# Patient Record
Sex: Male | Born: 1980 | Race: Black or African American | Hispanic: No | Marital: Married | Smoking: Never smoker
Health system: Southern US, Community
[De-identification: ages and names within clinical notes are randomized; demographics above are authoritative.]

## PROBLEM LIST (undated history)

## (undated) DIAGNOSIS — B181 Chronic viral hepatitis B without delta-agent: Secondary | ICD-10-CM

## (undated) DIAGNOSIS — R2231 Localized swelling, mass and lump, right upper limb: Secondary | ICD-10-CM

## (undated) HISTORY — PX: NO PAST SURGERIES: SHX2092

---

## 2017-01-04 ENCOUNTER — Ambulatory Visit (INDEPENDENT_AMBULATORY_CARE_PROVIDER_SITE_OTHER): Payer: Self-pay | Admitting: Physician Assistant

## 2017-01-18 ENCOUNTER — Ambulatory Visit (INDEPENDENT_AMBULATORY_CARE_PROVIDER_SITE_OTHER): Payer: Medicaid Other | Admitting: Physician Assistant

## 2017-01-18 ENCOUNTER — Encounter (INDEPENDENT_AMBULATORY_CARE_PROVIDER_SITE_OTHER): Payer: Self-pay | Admitting: Physician Assistant

## 2017-01-18 VITALS — BP 126/77 | HR 101 | Temp 99.0°F | Resp 18 | Ht 67.0 in | Wt 190.0 lb

## 2017-01-18 DIAGNOSIS — R768 Other specified abnormal immunological findings in serum: Secondary | ICD-10-CM | POA: Diagnosis not present

## 2017-01-18 DIAGNOSIS — D696 Thrombocytopenia, unspecified: Secondary | ICD-10-CM

## 2017-01-18 DIAGNOSIS — R109 Unspecified abdominal pain: Secondary | ICD-10-CM | POA: Diagnosis not present

## 2017-01-18 DIAGNOSIS — L989 Disorder of the skin and subcutaneous tissue, unspecified: Secondary | ICD-10-CM | POA: Diagnosis not present

## 2017-01-18 DIAGNOSIS — S29012A Strain of muscle and tendon of back wall of thorax, initial encounter: Secondary | ICD-10-CM | POA: Diagnosis not present

## 2017-01-18 DIAGNOSIS — R350 Frequency of micturition: Secondary | ICD-10-CM | POA: Diagnosis not present

## 2017-01-18 LAB — POCT URINALYSIS DIPSTICK
Bilirubin, UA: NEGATIVE
Glucose, UA: NEGATIVE
LEUKOCYTES UA: NEGATIVE
Nitrite, UA: NEGATIVE
PH UA: 5.5 (ref 5.0–8.0)
SPEC GRAV UA: 1.025 (ref 1.010–1.025)
UROBILINOGEN UA: 0.2 U/dL

## 2017-01-18 MED ORDER — NAPROXEN 500 MG PO TABS: 500.0000 mg | ORAL_TABLET | Freq: Two times a day (BID) | ORAL | 0 refills | Status: DC | PRN

## 2017-01-18 MED ORDER — CYCLOBENZAPRINE HCL 10 MG PO TABS: 10.0000 mg | ORAL_TABLET | Freq: Every day | ORAL | 0 refills | Status: DC

## 2017-01-18 NOTE — Progress Notes (Signed)
Subjective:  Patient ID: Caleb Chandler, male    DOB: Jun 12, 1980  Age: 36 y.o. MRN: [id number]  CC: new patient  HPI Caleb Chandler is a 36 y.o. male with a medical history of Hepatitis B presents as a new patient. Says he was taking medications for Hepatitis B for 7 months. Stopped taking medications on his own because medication was difficult to obtain in [place]. Medical review before arriving summarized HBsAg pos, HBeAg neg, Platelet 150x10, INR/PT/PTT normal, LFT normal, slight elevation of direct bilirubin, chlamydia negative.    Right wrist fracture and right palmar with lesion since he was 36 yo after falling from a mango tree. Did not have proper medical attention for his injury.     Right lower flank pain since approximately one month ago. Pain felt only when applying pressure or sleeping on right side. Has noted increased urinary frequency. No dysuria or hematuria.    ROS Review of Systems  Constitutional: Negative for chills, fever and malaise/fatigue.  Eyes: Negative for blurred vision.  Respiratory: Negative for shortness of breath.   Cardiovascular: Negative for chest pain and palpitations.  Gastrointestinal: Negative for abdominal pain and nausea.  Genitourinary: Negative for dysuria and hematuria.  Musculoskeletal: Positive for back pain. Negative for joint pain and myalgias.  Skin: Negative for rash.  Neurological: Negative for tingling and headaches.  Psychiatric/Behavioral: Negative for depression. The patient is not nervous/anxious.     Objective:  BP 126/77 (BP Location: Right Arm, Patient Position: Sitting, Cuff Size: Normal)   Pulse (!) 101   Temp 99 F (37.2 C) (Oral)   Resp 18   Ht 5\' 7"  (1.702 m)   Wt 190 lb (86.2 kg)   SpO2 96%   BMI 29.76 kg/m   BP/Weight 10/17/4032  Systolic BP 742  Diastolic BP 77  Wt. (Lbs) 190  BMI 29.76      Physical Exam  Constitutional: He is oriented to person, place, and time.  Well developed, well nourished, NAD,  polite  HENT:  Head: Normocephalic and atraumatic.  Eyes: No scleral icterus.  Neck: Normal range of motion. Neck supple. No thyromegaly present.  Cardiovascular: Normal rate, regular rhythm and normal heart sounds.   Pulmonary/Chest: Effort normal and breath sounds normal.  Abdominal: Soft. Bowel sounds are normal. There is no tenderness.  Musculoskeletal: He exhibits no edema.  Full aROM of the back with pain elicited on left rotation and right lateral flexion.  Neurological: He is alert and oriented to person, place, and time. He has normal reflexes. No cranial nerve deficit. Coordination normal.  Strength 5/5 throughout  Skin: Skin is warm and dry. No rash noted. No erythema. No pallor.  Large freely movable nodule with a large papular growth in the center of the right palm.   Psychiatric: He has a normal mood and affect. His behavior is normal. Thought content normal.  Vitals reviewed.    Assessment & Plan:   1. Strain of muscle and tendon of back wall of thorax, initial encounter - cyclobenzaprine (FLEXERIL) 10 MG tablet; Take 1 tablet (10 mg total) by mouth at bedtime.  Dispense: 15 tablet; Refill: 0 - naproxen (NAPROSYN) 500 MG tablet; Take 1 tablet (500 mg total) by mouth 2 (two) times daily as needed.  Dispense: 30 tablet; Refill: 0  2. Right flank pain - US Abdomen Limited RUQ; Future - cyclobenzaprine (FLEXERIL) 10 MG tablet; Take 1 tablet (10 mg total) by mouth at bedtime.  Dispense: 15 tablet; Refill: 0 - naproxen (NAPROSYN)  500 MG tablet; Take 1 tablet (500 mg total) by mouth 2 (two) times daily as needed.  Dispense: 30 tablet; Refill: 0  3. Urinary frequency - Urinalysis Dipstick with trace blood, protein, and ketone in clinic today. Retest at f/u.  4. Hepatitis B surface antigen positive - Hepatitis panel, acute - Comprehensive metabolic panel - US Abdomen Limited RUQ; Future  5. Thrombocytopenia (HCC) - CBC with Differential  6. Skin lesion on examination -  Referral to hand surgeon    Meds ordered this encounter  Medications  . cyclobenzaprine (FLEXERIL) 10 MG tablet    Sig: Take 1 tablet (10 mg total) by mouth at bedtime.    Dispense:  15 tablet    Refill:  0    Order Specific Question:   Supervising Provider    Answer:   Tresa Garter [id number]  . naproxen (NAPROSYN) 500 MG tablet    Sig: Take 1 tablet (500 mg total) by mouth 2 (two) times daily as needed.    Dispense:  30 tablet    Refill:  0    Order Specific Question:   Supervising Provider    Answer:   Tresa Garter [id number]    Follow-up: Return in about 4 weeks (around 02/15/2017) for Hep B, flank pain.   Clent Demark PA

## 2017-01-18 NOTE — Patient Instructions (Signed)
Flank Pain, Adult Flank pain is pain that is located on the side of the body between the upper abdomen and the back. This area is called the flank. The pain may occur over a short period of time (acute), or it may be long-term or recurring (chronic). It may be mild or severe. Flank pain can be caused by many things, including:  Muscle soreness or injury.  Kidney stones or kidney disease.  Stress.  A disease of the spine (vertebral disk disease).  A lung infection (pneumonia).  Fluid around the lungs (pulmonary edema).  A skin rash caused by the chickenpox virus (shingles).  Tumors that affect the back of the abdomen.  Gallbladder disease.  Follow these instructions at home:  Drink enough fluid to keep your urine clear or pale yellow.  Rest as told by your health care provider.  Take over-the-counter and prescription medicines only as told by your health care provider.  Keep a journal to track what has caused your flank pain and what has made it feel better.  Keep all follow-up visits as told by your health care provider. This is important. Contact a health care provider if:  Your pain is not controlled with medicine.  You have new symptoms.  Your pain gets worse.  You have a fever.  Your symptoms last longer than 2-3 days.  You have trouble urinating or you are urinating very frequently. Get help right away if:  You have trouble breathing or you are short of breath.  Your abdomen hurts or it is swollen or red.  You have nausea or vomiting.  You feel faint or you pass out.  You have blood in your urine. Summary  Flank pain is pain that is located on the side of the body between the upper abdomen and the back.  The pain may occur over a short period of time (acute), or it may be long-term or recurring (chronic). It may be mild or severe.  Flank pain can be caused by many things.  Contact your health care provider if your symptoms get worse or they last  longer than 2-3 days. This information is not intended to replace advice given to you by your health care provider. Make sure you discuss any questions you have with your health care provider. Document Released: 06/23/2005 Document Revised: 07/15/2016 Document Reviewed: 07/15/2016 Elsevier Interactive Patient Education  2018 Elsevier Inc.  

## 2017-01-19 LAB — CBC WITH DIFFERENTIAL/PLATELET
BASOS ABS: 0 10*3/uL (ref 0.0–0.2)
BASOS: 1 %
EOS (ABSOLUTE): 0.3 10*3/uL (ref 0.0–0.4)
EOS: 5 %
HEMATOCRIT: 46 % (ref 37.5–51.0)
Hemoglobin: 15.9 g/dL (ref 13.0–17.7)
IMMATURE GRANS (ABS): 0 10*3/uL (ref 0.0–0.1)
Immature Granulocytes: 0 %
LYMPHS ABS: 2.3 10*3/uL (ref 0.7–3.1)
LYMPHS: 45 %
MCH: 31.1 pg (ref 26.6–33.0)
MCHC: 34.6 g/dL (ref 31.5–35.7)
MCV: 90 fL (ref 79–97)
MONOCYTES: 12 %
Monocytes Absolute: 0.6 10*3/uL (ref 0.1–0.9)
NEUTROS ABS: 1.9 10*3/uL (ref 1.4–7.0)
Neutrophils: 37 %
Platelets: 155 10*3/uL (ref 150–379)
RBC: 5.12 x10E6/uL (ref 4.14–5.80)
RDW: 14 % (ref 12.3–15.4)
WBC: 5.1 10*3/uL (ref 3.4–10.8)

## 2017-01-19 LAB — COMPREHENSIVE METABOLIC PANEL
ALBUMIN: 4.7 g/dL (ref 3.5–5.5)
ALT: 29 IU/L (ref 0–44)
AST: 25 IU/L (ref 0–40)
Albumin/Globulin Ratio: 1.4 (ref 1.2–2.2)
Alkaline Phosphatase: 50 IU/L (ref 39–117)
BUN / CREAT RATIO: 14 (ref 9–20)
BUN: 14 mg/dL (ref 6–20)
Bilirubin Total: 0.6 mg/dL (ref 0.0–1.2)
CALCIUM: 10 mg/dL (ref 8.7–10.2)
CO2: 23 mmol/L (ref 20–29)
CREATININE: 0.98 mg/dL (ref 0.76–1.27)
Chloride: 105 mmol/L (ref 96–106)
GFR, EST AFRICAN AMERICAN: 115 mL/min/{1.73_m2} (ref >59)
GFR, EST NON AFRICAN AMERICAN: 99 mL/min/{1.73_m2} (ref >59)
GLUCOSE: 107 mg/dL — AB (ref 65–99)
Globulin, Total: 3.3 g/dL (ref 1.5–4.5)
Potassium: 4.3 mmol/L (ref 3.5–5.2)
Sodium: 142 mmol/L (ref 134–144)
TOTAL PROTEIN: 8 g/dL (ref 6.0–8.5)

## 2017-01-19 LAB — HEPATITIS PANEL, ACUTE
HEP A IGM: NEGATIVE
Hep B C IgM: NEGATIVE
Hepatitis B Surface Ag: POSITIVE — AB

## 2017-01-20 ENCOUNTER — Telehealth (INDEPENDENT_AMBULATORY_CARE_PROVIDER_SITE_OTHER): Payer: Self-pay

## 2017-01-20 NOTE — Telephone Encounter (Signed)
-----   Message from Clent Demark, PA-C sent at 01/19/2017  5:59 PM EDT ----- Patient to return here for further testing before referral to infectious disease. Awaiting RUQ [place].

## 2017-01-20 NOTE — Telephone Encounter (Signed)
/  Patient verified DOB Patient is aware of needing further Hepatitis workup prior to being referred to [facility]. Patient is scheduled for 01/25/17 AT 10:00. Patient had no further questions at this time.

## 2017-01-25 ENCOUNTER — Other Ambulatory Visit (INDEPENDENT_AMBULATORY_CARE_PROVIDER_SITE_OTHER): Payer: Medicaid Other

## 2017-01-25 ENCOUNTER — Other Ambulatory Visit (INDEPENDENT_AMBULATORY_CARE_PROVIDER_SITE_OTHER): Payer: Self-pay | Admitting: Physician Assistant

## 2017-01-25 DIAGNOSIS — R768 Other specified abnormal immunological findings in serum: Secondary | ICD-10-CM

## 2017-01-25 NOTE — Addendum Note (Signed)
Addended by: [name] on: 01/25/2017 03:23 PM   Modules accepted: Orders

## 2017-01-26 ENCOUNTER — Other Ambulatory Visit (INDEPENDENT_AMBULATORY_CARE_PROVIDER_SITE_OTHER): Payer: Self-pay | Admitting: Physician Assistant

## 2017-01-26 DIAGNOSIS — K759 Inflammatory liver disease, unspecified: Secondary | ICD-10-CM

## 2017-01-26 LAB — HEPATITIS B CORE ANTIBODY, TOTAL: Hep B Core Total Ab: POSITIVE — AB

## 2017-01-26 LAB — HEPATITIS B SURFACE ANTIBODY,QUALITATIVE: Hep B Surface Ab, Qual: NONREACTIVE

## 2017-01-27 ENCOUNTER — Ambulatory Visit (HOSPITAL_COMMUNITY)
Admission: RE | Admit: 2017-01-27 | Discharge: 2017-01-27 | Disposition: A | Discharge status: Home / Self Care | Payer: Medicaid Other | Source: Ambulatory Visit | Attending: Physician Assistant | Admitting: Physician Assistant

## 2017-01-27 DIAGNOSIS — R109 Unspecified abdominal pain: Secondary | ICD-10-CM

## 2017-01-27 DIAGNOSIS — R768 Other specified abnormal immunological findings in serum: Secondary | ICD-10-CM | POA: Insufficient documentation

## 2017-01-27 DIAGNOSIS — K760 Fatty (change of) liver, not elsewhere classified: Secondary | ICD-10-CM | POA: Insufficient documentation

## 2017-02-15 ENCOUNTER — Ambulatory Visit (INDEPENDENT_AMBULATORY_CARE_PROVIDER_SITE_OTHER): Payer: Medicaid Other | Admitting: Physician Assistant

## 2017-02-17 ENCOUNTER — Ambulatory Visit (INDEPENDENT_AMBULATORY_CARE_PROVIDER_SITE_OTHER): Payer: Medicaid Other | Admitting: Physician Assistant

## 2017-02-21 ENCOUNTER — Ambulatory Visit (INDEPENDENT_AMBULATORY_CARE_PROVIDER_SITE_OTHER): Payer: Medicaid Other | Admitting: Internal Medicine

## 2017-02-21 ENCOUNTER — Encounter: Payer: Self-pay | Admitting: Internal Medicine

## 2017-02-21 DIAGNOSIS — B181 Chronic viral hepatitis B without delta-agent: Secondary | ICD-10-CM | POA: Diagnosis not present

## 2017-02-21 NOTE — Assessment & Plan Note (Signed)
He has chronic hepatitis B. His hepatitis B DNA viral load was relatively low 2 months after stopping therapy with entecavir. I will get lab work today to restage his infection and see him back in 4 weeks to determine if he needs to start back on treatment.

## 2017-02-21 NOTE — Progress Notes (Signed)
[facility] for Infectious Disease  Reason for Consult: Chronic hepatitis B Referring Physician:  Domenica Fail PA  Assessment: He has chronic hepatitis B. His hepatitis B DNA viral load was relatively low 2 months after stopping therapy with entecavir. I will get lab work today to restage his infection and see him back in 4 weeks to determine if he needs to start back on treatment.   Plan: 1. Staging lab work today 2. I recommended that his fiance be tested for hepatitis B. If she is not immune she should receive hepatitis B vaccination   Patient Active Problem List   Diagnosis Date Noted  . Chronic viral hepatitis B without delta-agent ([place]) 02/21/2017    Patient's Medications  New Prescriptions   No medications on file  Previous Medications   No medications on file  Modified Medications   No medications on file  Discontinued Medications   CYCLOBENZAPRINE (FLEXERIL) 10 MG TABLET    Take 1 tablet (10 mg total) by mouth at bedtime.   NAPROXEN (NAPROSYN) 500 MG TABLET    Take 1 tablet (500 mg total) by mouth 2 (two) times daily as needed.    HPI: Caleb Chandler is a 36 y.o. male who is referred for evaluation and ongoing management of chronic hepatitis B. He is a refugee from the [place] who was found to have chronic hepatitis B when he donated blood while living in [place] 2 years ago. This was the first time he had ever donated blood. He is not aware of ever being tested for hepatitis previously. He is not sure how he was infected. He has had 5 lifetime sexual contacts, all male. He says that he use condoms consistently with each of them. He has never had a blood transfusion. He has never had a tattoo or other injections while living in [place] or [place].  His mother is living but he is not aware of her hepatitis status.   He saw a physician in [place] last December and started on entecavir. He was also taking Immulant Plus, a supplement containing  Echinacea and goldenseal root. He does not believe that he had a hepatitis B viral load done before starting therapy. He took entecavir daily until sometime in May of this year. He could not continue it because he could not afford the medication. He does not recall missing any doses between December and May. He did not have any problems tolerating it. Lab work done on 09/22/2016 showed that his platelets were slightly low at 146,000. His liver enzymes were normal.  He underwent an immigration physical exam in [place] on 11/06/2016. Notes by the examining physician mentioned that he was hepatitis B E antigen negative. He saw another physician on 11/15/2016. That physician told him he needed to have blood work to determine if he still needed treatment for hepatitis B. His hepatitis B DNA viral load at that time was 929 international units per mL.  He immigrated here to [place] 2 months ago. He is looking for work. He has previously been a Education officer, museum, primarily working with other refugees. He has also worked in an [employer] in [place]. He is not on any other medications. He has never consumed alcohol. He does not use tobacco products. He has never been hospitalized or had any surgeries. He has no known allergies. He is currently engaged to be married next year. His fiance is living in [place]. They have not been sexually  active yet. He does not know if she has been tested for hepatitis B or ever been vaccinated.    Review of Systems: Review of Systems  Constitutional: Negative for chills, diaphoresis, fever, malaise/fatigue and weight loss.  HENT: Negative for sore throat.   Respiratory: Negative for cough, sputum production and shortness of breath.   Cardiovascular: Negative for chest pain.  Gastrointestinal: Negative for abdominal pain, diarrhea, heartburn, nausea and vomiting.  Genitourinary: Negative for dysuria and frequency.  Musculoskeletal: Negative for joint pain and myalgias.  Skin:  Negative for rash.  Neurological: Negative for dizziness and headaches.  Psychiatric/Behavioral: Negative for depression and substance abuse. The patient is not nervous/anxious.       No past medical history on file.  Social History  Substance Use Topics  . Smoking status: Never Smoker  . Smokeless tobacco: Never Used  . Alcohol use No    No family history on file. No Known Allergies  OBJECTIVE: Vitals:   02/21/17 0922  BP: (!) 144/84  Pulse: 90  Temp: 98.5 F (36.9 C)  TempSrc: Oral  Weight: 193 lb (87.5 kg)  Height: 5' 6.93" (1.7 m)   Body mass index is 30.29 kg/m.   Physical Exam  Constitutional: He is oriented to person, place, and time.  He is quite pleasant and in good spirits.  HENT:  Mouth/Throat: No oropharyngeal exudate.  Eyes: Conjunctivae are normal.  Cardiovascular: Normal rate and regular rhythm.   No murmur heard. Pulmonary/Chest: Effort normal and breath sounds normal.  Abdominal: Soft. He exhibits no distension and no mass. There is no tenderness.  Musculoskeletal: Normal range of motion.  He has a large callus on the palm of his right hand that developed after he broke his wrist when he was a young child.  Neurological: He is alert and oriented to person, place, and time.  Skin: No rash noted.  Psychiatric: Mood and affect normal.    Microbiology: No results found for this or any previous visit (from the past 240 hour(s)).  Michel Bickers, MD [facility] for Infect[facility] [phone] pager   [phone] cell 02/21/2017, 10:03 AM

## 2017-02-23 ENCOUNTER — Encounter (INDEPENDENT_AMBULATORY_CARE_PROVIDER_SITE_OTHER): Payer: Self-pay | Admitting: Physician Assistant

## 2017-02-23 ENCOUNTER — Ambulatory Visit (INDEPENDENT_AMBULATORY_CARE_PROVIDER_SITE_OTHER): Payer: Medicaid Other | Admitting: Physician Assistant

## 2017-02-23 VITALS — BP 130/77 | HR 98 | Temp 98.3°F | Wt 200.6 lb

## 2017-02-23 DIAGNOSIS — S29012D Strain of muscle and tendon of back wall of thorax, subsequent encounter: Secondary | ICD-10-CM

## 2017-02-23 DIAGNOSIS — R3129 Other microscopic hematuria: Secondary | ICD-10-CM

## 2017-02-23 DIAGNOSIS — R350 Frequency of micturition: Secondary | ICD-10-CM | POA: Diagnosis not present

## 2017-02-23 DIAGNOSIS — R109 Unspecified abdominal pain: Secondary | ICD-10-CM

## 2017-02-23 DIAGNOSIS — L989 Disorder of the skin and subcutaneous tissue, unspecified: Secondary | ICD-10-CM | POA: Diagnosis not present

## 2017-02-23 DIAGNOSIS — B181 Chronic viral hepatitis B without delta-agent: Secondary | ICD-10-CM | POA: Diagnosis not present

## 2017-02-23 LAB — POCT URINALYSIS DIPSTICK
BILIRUBIN UA: NEGATIVE
GLUCOSE UA: NEGATIVE
Leukocytes, UA: NEGATIVE
Nitrite, UA: NEGATIVE
Protein, UA: NEGATIVE
Spec Grav, UA: 1.025 (ref 1.010–1.025)
Urobilinogen, UA: 0.2 E.U./dL
pH, UA: 5 (ref 5.0–8.0)

## 2017-02-23 NOTE — Patient Instructions (Signed)

## 2017-02-23 NOTE — Progress Notes (Signed)
   Subjective:  Patient ID: Caleb Chandler, male    DOB: [birth year]  Age: 36 y.o. MRN: [id number]  CC: No chief complaint on file.   HPI Caleb Chandler is a 36 y.o. male with a medical history of chronic Hepatitis B infection presents to f/u on back pain, flank pain, urinary frequency, HBV infection, and skin lesion on right hand.    Back pain and flank pain have resolved with cyclobenzaprine and naproxen. Urinary frequency persists and includes nocturia. HBV infection is being managed by ID. Has not heard from hand surgeon for skin lesion of right palm.  ROS Review of Systems  Constitutional: Negative for chills, fever and malaise/fatigue.  Eyes: Negative for blurred vision.  Respiratory: Negative for shortness of breath.   Cardiovascular: Negative for chest pain and palpitations.  Gastrointestinal: Negative for abdominal pain and nausea.  Genitourinary: Positive for frequency. Negative for dysuria and hematuria.  Musculoskeletal: Negative for joint pain and myalgias.  Skin: Negative for rash.  Neurological: Negative for tingling and headaches.  Psychiatric/Behavioral: Negative for depression. The patient is not nervous/anxious.     Objective:  BP 130/77 (BP Location: Left Arm, Patient Position: Sitting, Cuff Size: Normal)   Pulse 98   Temp 98.3 F (36.8 C) (Oral)   Wt 200 lb 9.6 oz (91 kg)   SpO2 97%   BMI 31.49 kg/m   BP/Weight 02/23/2017 74/01/4495 11/17/9161  Systolic BP 846 659 935  Diastolic BP 77 84 77  Wt. (Lbs) 200.6 193 190  BMI 31.49 30.29 29.76      Physical Exam  Constitutional: He is oriented to person, place, and time.  Well developed, well nourished, NAD, polite, pleasant  HENT:  Head: Normocephalic and atraumatic.  Eyes: No scleral icterus.  Cardiovascular: Normal rate.   Pulmonary/Chest: Effort normal.  Neurological: He is alert and oriented to person, place, and time. No cranial nerve deficit. Coordination normal.  Skin: Skin is warm and dry. No rash  noted. No erythema. No pallor.  Large papular lesion on right palm  Psychiatric: He has a normal mood and affect. His behavior is normal. Thought content normal.  Vitals reviewed.    Assessment & Plan:    1. Strain of muscle and tendon of back wall of thorax, initial encounter - Resolved with cyclobenzaprine and naproxen  2. Right flank pain - Resolved with cyclobenzaprine and naproxen  3. Urinary frequency - Urinalysis Dipstick with trace blood and ketone in clinic today. No proteins at this visit. Review of [place] labs he brought in today revealed no blood, proteins, or ketones.  4. Microscopic Hematuria - ANA w/reflex  5. Hepatitis B surface antigen positive - Hepatitis panel, acute - Comprehensive metabolic panel - US Abdomen Limited RUQ; Future  6. Thrombocytopenia (HCC) - CBC with Differential  7. Skin lesion on examination - Referral to hand surgeon

## 2017-02-24 LAB — LIVER FIBROSIS, FIBROTEST-ACTITEST
ALPHA-2-MACROGLOBULIN: 220 mg/dL (ref 106–279)
ALT: 24 U/L (ref 9–46)
APOLIPOPROTEIN A1: 112 mg/dL (ref 94–176)
BILIRUBIN: 0.3 mg/dL (ref 0.2–1.2)
FIBROSIS SCORE: 0.15
GGT: 18 U/L (ref 3–90)
HAPTOGLOBIN: 140 mg/dL (ref 43–212)
Necroinflammat ACT Score: 0.09
REFERENCE ID: 2154934

## 2017-02-24 LAB — HEPATITIS B DNA, ULTRAQUANTITATIVE, PCR
HEPATITIS B DNA (CALC): 3.4 {Log_IU}/mL — AB
Hepatitis B DNA: 2490 IU/mL — ABNORMAL HIGH

## 2017-02-24 LAB — ANA W/REFLEX: ANA: NEGATIVE

## 2017-02-25 LAB — COMPREHENSIVE METABOLIC PANEL
AG RATIO: 1.4 (calc) (ref 1.0–2.5)
ALBUMIN MSPROF: 4.5 g/dL (ref 3.6–5.1)
ALT: 26 U/L (ref 9–46)
AST: 20 U/L (ref 10–40)
Alkaline phosphatase (APISO): 44 U/L (ref 40–115)
BILIRUBIN TOTAL: 0.5 mg/dL (ref 0.2–1.2)
BUN: 18 mg/dL (ref 7–25)
CHLORIDE: 105 mmol/L (ref 98–110)
CO2: 26 mmol/L (ref 20–32)
Calcium: 9.8 mg/dL (ref 8.6–10.3)
Creat: 0.98 mg/dL (ref 0.60–1.35)
GLOBULIN: 3.3 g/dL (ref 1.9–3.7)
GLUCOSE: 99 mg/dL (ref 65–99)
Potassium: 4.2 mmol/L (ref 3.5–5.3)
Sodium: 140 mmol/L (ref 135–146)
TOTAL PROTEIN: 7.8 g/dL (ref 6.1–8.1)

## 2017-02-25 LAB — HEPATITIS B E ANTIBODY: HEP B E AB: REACTIVE — AB

## 2017-02-25 LAB — HEPATITIS C ANTIBODY
Hepatitis C Ab: NONREACTIVE
SIGNAL TO CUT-OFF: 0.03 (ref <1.00)

## 2017-02-25 LAB — HEPATITIS DELTA ANTIBODY: Hepatitis D Ab, Total: NEGATIVE

## 2017-02-25 LAB — HIV ANTIBODY (ROUTINE TESTING W REFLEX): HIV 1&2 Ab, 4th Generation: NONREACTIVE

## 2017-02-25 LAB — PROTIME-INR
INR: 1
PROTHROMBIN TIME: 10.8 s (ref 9.0–11.5)

## 2017-02-27 ENCOUNTER — Ambulatory Visit (INDEPENDENT_AMBULATORY_CARE_PROVIDER_SITE_OTHER): Payer: Medicaid Other

## 2017-02-27 ENCOUNTER — Ambulatory Visit (INDEPENDENT_AMBULATORY_CARE_PROVIDER_SITE_OTHER): Payer: Medicaid Other | Admitting: Orthopaedic Surgery

## 2017-02-27 ENCOUNTER — Telehealth: Payer: Self-pay

## 2017-02-27 ENCOUNTER — Encounter (INDEPENDENT_AMBULATORY_CARE_PROVIDER_SITE_OTHER): Payer: Self-pay | Admitting: Orthopaedic Surgery

## 2017-02-27 DIAGNOSIS — R2231 Localized swelling, mass and lump, right upper limb: Secondary | ICD-10-CM

## 2017-02-27 DIAGNOSIS — M67441 Ganglion, right hand: Secondary | ICD-10-CM | POA: Diagnosis not present

## 2017-02-27 NOTE — Telephone Encounter (Signed)
Patient aware of negative autoimmune disease. Caleb Chandler, CMA

## 2017-02-27 NOTE — Telephone Encounter (Signed)
-----   Message from Clent Demark, PA-C sent at 02/27/2017  1:49 PM EDT ----- Autoimmune disease negative.

## 2017-02-27 NOTE — Progress Notes (Signed)
   Office Visit Note   Patient: Caleb Chandler           Date of Birth: Nov 04, 1980           MRN: [id number] Visit Date: 02/27/2017              Requested by: Clent Demark, PA-C [place], [place] [address] PCP: Clent Demark, PA-C   Assessment & Plan: Visit Diagnoses:  1. Mass of right hand     Plan: Patient is a subcutaneous large calcified mass in the palm of his right hand. Patient will need MRI to rule out malignancy given its size and calcification x-rays. Follow-up after the MRI.  Follow-Up Instructions: Return in about 10 days (around 03/09/2017).   Orders:  Orders Placed This Encounter  Procedures  . XR Hand Complete Right   No orders of the defined types were placed in this encounter.     Procedures: No procedures performed   Clinical Data: No additional findings.   Subjective: Chief Complaint  Patient presents with  . Right Hand - Pain, Cyst    Patient is a very pleasant 36 year old gentleman who was in with an enlarging palmar mass of his right hand for the last 20 years. It is symptomatic when he uses his right hand. Denies any numbness and tingling. Denies any focal motor deficits. He denies any injuries or previous surgeries. He denies a history of cancer. Denies constitutional symptoms. The pain does not radiate. It's worse with direct palpation.    Review of Systems  Constitutional: Negative.   All other systems reviewed and are negative.    Objective: Vital Signs: There were no vitals taken for this visit.  Physical Exam  Constitutional: He is oriented to person, place, and time. He appears well-developed and well-nourished.  HENT:  Head: Normocephalic and atraumatic.  Eyes: Pupils are equal, round, and reactive to light.  Neck: Neck supple.  Pulmonary/Chest: Effort normal.  Abdominal: Soft.  Musculoskeletal: Normal range of motion.  Neurological: He is alert and oriented to person, place, and time.  Skin: Skin  is warm.  Psychiatric: He has a normal mood and affect. His behavior is normal. Judgment and thought content normal.  Nursing note and vitals reviewed.   Ortho Exam Right hand exam shows a large palpable lesion in the central portion of his palm. There is no drainage or skin changes or evidence of infection. This is slightly tender to palpation. He has full use of his hand. Specialty Comments:  No specialty comments available.  Imaging: Xr Hand Complete Right  Result Date: 02/27/2017 Large calcified lesion in the soft tissues on the palmar side of the hand without any obvious evidence of bony involvement    PMFS History: Patient Active Problem List   Diagnosis Date Noted  . Chronic viral hepatitis B without delta-agent ([place]) 02/21/2017   No past medical history on file.  No family history on file.  No past surgical history on file. Social History   Occupational History  . Not on file.   Social History Main Topics  . Smoking status: Never Smoker  . Smokeless tobacco: Never Used  . Alcohol use No  . Drug use: No  . Sexual activity: Not Currently

## 2017-02-27 NOTE — Addendum Note (Signed)
Addended by: Precious Bard on: 02/27/2017 10:46 AM   Modules accepted: Orders

## 2017-03-09 ENCOUNTER — Ambulatory Visit (INDEPENDENT_AMBULATORY_CARE_PROVIDER_SITE_OTHER): Payer: Medicaid Other | Admitting: Orthopaedic Surgery

## 2017-03-12 ENCOUNTER — Ambulatory Visit
Admission: RE | Admit: 2017-03-12 | Discharge: 2017-03-12 | Disposition: A | Discharge status: Home / Self Care | Payer: Medicaid Other | Source: Ambulatory Visit | Attending: Orthopaedic Surgery | Admitting: Orthopaedic Surgery

## 2017-03-12 DIAGNOSIS — R2231 Localized swelling, mass and lump, right upper limb: Secondary | ICD-10-CM

## 2017-03-12 MED ORDER — GADOBENATE DIMEGLUMINE 529 MG/ML IV SOLN
17.0000 mL | Freq: Once | INTRAVENOUS | Status: AC | PRN
  Administered 2017-03-12: 17 mL via INTRAVENOUS

## 2017-03-14 ENCOUNTER — Ambulatory Visit (INDEPENDENT_AMBULATORY_CARE_PROVIDER_SITE_OTHER): Payer: Medicaid Other | Admitting: Orthopaedic Surgery

## 2017-03-14 ENCOUNTER — Encounter (INDEPENDENT_AMBULATORY_CARE_PROVIDER_SITE_OTHER): Payer: Self-pay | Admitting: Orthopaedic Surgery

## 2017-03-14 DIAGNOSIS — R2231 Localized swelling, mass and lump, right upper limb: Secondary | ICD-10-CM

## 2017-03-14 NOTE — Addendum Note (Signed)
Addended by: Precious Bard on: 03/14/2017 10:07 AM   Modules accepted: Orders

## 2017-03-14 NOTE — Progress Notes (Signed)
   Office Visit Note   Patient: Caleb Chandler           Date of Birth: Jan 19, 1981           MRN: [id number] Visit Date: 03/14/2017              Requested by: Clent Demark, PA-C [place], [place] [address] PCP: Clent Demark, PA-C   Assessment & Plan: Visit Diagnoses:  1. Mass of right hand     Plan: MRI shows no aggressive features of the mass.  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  Referral to Dr. Milly Jakob [facility]  Follow-Up Instructions: Return if symptoms worsen or fail to improve.   Orders:  No orders of the defined types were placed in this encounter.  No orders of the defined types were placed in this encounter.     Procedures: No procedures performed   Clinical Data: No additional findings.   Subjective: Chief Complaint  Patient presents with  . Right Hand - Pain    Patient is here to review his right hand MRI.  Denies any changes in history.    Review of Systems   Objective: Vital Signs: There were no vitals taken for this visit.  Physical Exam  Ortho Exam Right hand is stable.  He has a palpable firm mass in the palm of his hand. Specialty Comments:  No specialty comments available.  Imaging: No results found.   PMFS History: Patient Active Problem List   Diagnosis Date Noted  . Chronic viral hepatitis B without delta-agent ([place]) 02/21/2017   No past medical history on file.  No family history on file.  No past surgical history on file. Social History   Occupational History  . Not on file.   Social History Main Topics  . Smoking status: Never Smoker  . Smokeless tobacco: Never Used  . Alcohol use No  . Drug use: No  . Sexual activity: Not Currently

## 2017-03-16 DIAGNOSIS — R2231 Localized swelling, mass and lump, right upper limb: Secondary | ICD-10-CM

## 2017-03-16 HISTORY — DX: Localized swelling, mass and lump, right upper limb: R22.31

## 2017-03-21 ENCOUNTER — Encounter: Payer: Self-pay | Admitting: Internal Medicine

## 2017-03-21 ENCOUNTER — Ambulatory Visit (INDEPENDENT_AMBULATORY_CARE_PROVIDER_SITE_OTHER): Payer: Medicaid Other | Admitting: Internal Medicine

## 2017-03-21 DIAGNOSIS — B181 Chronic viral hepatitis B without delta-agent: Secondary | ICD-10-CM

## 2017-03-21 NOTE — Assessment & Plan Note (Signed)
He has chronic hepatitis B e antigen negative hepatitis. He was on entecavir for a brief period of time before emigrating to the [place] last year.He did not have any problems tolerating it and never missed doses. He has no liver enzyme elevation now and no evidence of fibrosis or cirrhosis. His recent viral loads have been relatively low. Based on current guidelines there is no clear necessity to restart treatment now.I told him that we could wait and repeat labs in 6 months or restart entecavir now. He favors the wait and see approach. He still plans on being married next year. He has not spoken with his fiance since his last visit. He does plan on having her tested and, assuming she is hepatitis B negative, having her vaccinated. He will follow-up after lab work in 6 months

## 2017-03-21 NOTE — Progress Notes (Signed)
[facility]  Patient Active Problem List   Diagnosis Date Noted  . Chronic viral hepatitis B without delta-agent ([place]) 02/21/2017      Medication List    as of 03/21/2017  9:50 AM   You have not been prescribed any medications.     Subjective: Caleb Chandler he is in for his routine visit. He has had no problems or concerns since his visit several weeks ago.  Review of Systems: Review of Systems  Constitutional: Negative for chills, diaphoresis, fever, malaise/fatigue and weight loss.  Gastrointestinal: Negative for abdominal pain, nausea and vomiting.    No past medical history on file.  Social History   Tobacco Use  . Smoking status: Never Smoker  . Smokeless tobacco: Never Used  Substance Use Topics  . Alcohol use: No  . Drug use: No    No family history on file.  No Known Allergies  Objective: Vitals:   03/21/17 0920  BP: 121/78  Pulse: 78  Temp: 98.5 F (36.9 C)  TempSrc: Oral  Weight: 193 lb (87.5 kg)  Height: 5\' 6"  (1.676 m)   Body mass index is 31.15 kg/m.  Physical Exam  Constitutional: He is oriented to person, place, and time.  He is smiling and in good spirits.  Eyes: Conjunctivae are normal.  Abdominal: Soft. He exhibits no distension and no mass. There is no tenderness.  Neurological: He is alert and oriented to person, place, and time.  Skin: No rash noted.  Psychiatric: Mood and affect normal.    Lab Results CMP     Component Value Date/Time   NA 140 02/21/2017 1011   NA 142 01/18/2017 1117   K 4.2 02/21/2017 1011   CL 105 02/21/2017 1011   CO2 26 02/21/2017 1011   GLUCOSE 99 02/21/2017 1011   BUN 18 02/21/2017 1011   BUN 14 01/18/2017 1117   CREATININE 0.98 02/21/2017 1011   CALCIUM 9.8 02/21/2017 1011   PROT 7.8 02/21/2017 1011   PROT 8.0 01/18/2017 1117   ALBUMIN 4.7 01/18/2017 1117   AST 20 02/21/2017 1011   ALT 24 02/21/2017 1011   ALT 26 02/21/2017 1011   ALKPHOS 50 01/18/2017  1117   BILITOT 0.5 02/21/2017 1011   BILITOT 0.6 01/18/2017 1117   GFRNONAA 99 01/18/2017 1117   GFRAA 115 01/18/2017 1117   Hepatitis B e antigen negative; hepatitis b e antibody positive Fibrosis score: F0 Abdominal ultrasound 01/2017: Hepatic steatosis without evidence of cirrhosis or liver masses Hepatitis B DNA viral load: 2490 international units per mL   Problem List Items Addressed This Visit      Unprioritized   Chronic viral hepatitis B without delta-agent ([place])    He has chronic hepatitis B e antigen negative hepatitis. He was on entecavir for a brief period of time before emigrating to the [place] last year.He did not have any problems tolerating it and never missed doses. He has no liver enzyme elevation now and no evidence of fibrosis or cirrhosis. His recent viral loads have been relatively low. Based on current guidelines there is no clear necessity to restart treatment now.I told him that we could wait and repeat labs in 6 months or restart entecavir now. He favors the wait and see approach. He still plans on being married next year. He has not spoken with his fiance since his last visit. He does plan on having her tested and, assuming she is hepatitis B  negative, having her vaccinated. He will follow-up after lab work in 6 months      Relevant Orders   Comprehensive metabolic panel   Hepatitis B e antigen   Hepatitis B DNA, ultraquantitative, PCR       Caleb Bickers, MD [facility] [place] [phone] pager   [phone] cell 03/21/2017, 9:50 AM

## 2017-03-23 ENCOUNTER — Other Ambulatory Visit: Payer: Self-pay | Admitting: Orthopedic Surgery

## 2017-03-28 ENCOUNTER — Other Ambulatory Visit: Payer: Self-pay

## 2017-03-28 ENCOUNTER — Encounter (HOSPITAL_BASED_OUTPATIENT_CLINIC_OR_DEPARTMENT_OTHER): Payer: Self-pay | Admitting: *Deleted

## 2017-03-28 NOTE — Pre-Procedure Instructions (Signed)
To come for CMET

## 2017-03-29 ENCOUNTER — Encounter (HOSPITAL_BASED_OUTPATIENT_CLINIC_OR_DEPARTMENT_OTHER)
Admission: RE | Admit: 2017-03-29 | Discharge: 2017-03-29 | Disposition: A | Discharge status: Home / Self Care | Payer: Medicaid Other | Source: Ambulatory Visit | Attending: Orthopedic Surgery | Admitting: Orthopedic Surgery

## 2017-03-29 DIAGNOSIS — Z01812 Encounter for preprocedural laboratory examination: Secondary | ICD-10-CM | POA: Diagnosis not present

## 2017-03-29 LAB — COMPREHENSIVE METABOLIC PANEL
ALBUMIN: 4.1 g/dL (ref 3.5–5.0)
ALK PHOS: 43 U/L (ref 38–126)
ALT: 33 U/L (ref 17–63)
ANION GAP: 8 (ref 5–15)
AST: 36 U/L (ref 15–41)
BUN: 9 mg/dL (ref 6–20)
CO2: 25 mmol/L (ref 22–32)
Calcium: 9.6 mg/dL (ref 8.9–10.3)
Chloride: 106 mmol/L (ref 101–111)
Creatinine, Ser: 0.91 mg/dL (ref 0.61–1.24)
GFR calc Af Amer: >60 mL/min (ref >60)
GFR calc non Af Amer: >60 mL/min (ref >60)
GLUCOSE: 94 mg/dL (ref 65–99)
POTASSIUM: 4.1 mmol/L (ref 3.5–5.1)
Sodium: 139 mmol/L (ref 135–145)
Total Bilirubin: 0.9 mg/dL (ref 0.3–1.2)
Total Protein: 7.4 g/dL (ref 6.5–8.1)

## 2017-03-31 NOTE — H&P (Signed)
Caleb Chandler is an 36 y.o. male.   CC / Reason for Visit: Right hand mass HPI: Patient is a 36 year old, right-hand-dominant, male who indicates that 35 or more years ago the patient had a wrist fracture in the right wrist.  As the fracture was healing, the patient noticed that he started having a growth of the palmar aspect of his hand.  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  He is here today for further evaluation and treatment recommendations and potential surgical options.  Past Medical History:  Diagnosis Date  . Chronic viral hepatitis B without delta-agent ([place])   . Mass of hand, right 03/2017    Past Surgical History:  Procedure Laterality Date  . NO PAST SURGERIES      History reviewed. No pertinent family history. Social History:  reports that he has been smoking.  he has never used smokeless tobacco. He reports that he does not drink alcohol or use drugs.  Allergies: No Known Allergies  No medications prior to admission.    No results found for this or any previous visit (from the past 48 hour(s)). No results found.  Review of Systems  All other systems reviewed and are negative.   Height 5\' 6"  (1.676 m), weight 87.5 kg (193 lb). Physical Exam  Constitutional:  WD, WN, NAD HEENT:  NCAT, EOMI Neuro/Psych:  Alert & oriented to person, place, and time; appropriate mood & affect Lymphatic: No generalized UE edema or lymphadenopathy Extremities / MSK:  Both UE are normal with respect to appearance, ranges of motion, joint stability, muscle strength/tone, sensation, & perfusion except as otherwise noted:  The right digits and dorsum of the hand are normal in appearance.  There is a 3.5 cm x 2 cm x 1 cm mass in the palmar aspect of the hand which is very firm and stationary.  It does not move with any type of tendon excursion.  It is not painful to  palpate unless pushed forcefully.  Light touch sensibility is intact over all digital fingertips on all aspects of the digit.  There is full digital range of motion.  Capillary refill is brisk.  Labs / Xrays:  No radiographic studies obtained today.  X-rays involving 3 views of the right hand taken in October 2018 as well as an MRI were reviewed and demonstrated a calcified mass of the right hand.  Please see report for further information.  Assessment: Right hand mass  Plan:  The findings are discussed with the patient.  It is decided he would like to move forward with right hand mass excision.  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  There is also some question as to whether or not the skin will be able to close appropriately over the region of where the mass was.  If it cannot, skin grafting may have to be performed. The details of the operative procedure were discussed with the patient.  Questions were invited and answered.  In addition to the goal of the procedure, the risks of the procedure to include but not limited to bleeding; infection; damage to the nerves or blood vessels that could result in bleeding, numbness, weakness, chronic pain, and the need for additional procedures; stiffness; the need for revision surgery; and anesthetic risks were reviewed.  No specific outcome was guaranteed or implied.  We will have our surgery coordinator contact this patient to schedule.  Jolyn Nap, MD 03/31/2017, 8:25 PM

## 2017-04-03 ENCOUNTER — Encounter (HOSPITAL_BASED_OUTPATIENT_CLINIC_OR_DEPARTMENT_OTHER)
Admission: RE | Disposition: A | Discharge status: Home / Self Care | Payer: Self-pay | Source: Ambulatory Visit | Attending: Orthopedic Surgery

## 2017-04-03 ENCOUNTER — Ambulatory Visit (HOSPITAL_BASED_OUTPATIENT_CLINIC_OR_DEPARTMENT_OTHER)
Admission: RE | Admit: 2017-04-03 | Discharge: 2017-04-03 | Disposition: A | Discharge status: Home / Self Care | Payer: Medicaid Other | Source: Ambulatory Visit | Attending: Orthopedic Surgery | Admitting: Orthopedic Surgery

## 2017-04-03 ENCOUNTER — Other Ambulatory Visit: Payer: Self-pay

## 2017-04-03 ENCOUNTER — Encounter (HOSPITAL_BASED_OUTPATIENT_CLINIC_OR_DEPARTMENT_OTHER): Payer: Self-pay | Admitting: Certified Registered"

## 2017-04-03 ENCOUNTER — Ambulatory Visit (HOSPITAL_BASED_OUTPATIENT_CLINIC_OR_DEPARTMENT_OTHER): Payer: Medicaid Other | Admitting: Certified Registered"

## 2017-04-03 DIAGNOSIS — E669 Obesity, unspecified: Secondary | ICD-10-CM | POA: Insufficient documentation

## 2017-04-03 DIAGNOSIS — Z6830 Body mass index (BMI) 30.0-30.9, adult: Secondary | ICD-10-CM | POA: Insufficient documentation

## 2017-04-03 DIAGNOSIS — F17200 Nicotine dependence, unspecified, uncomplicated: Secondary | ICD-10-CM | POA: Insufficient documentation

## 2017-04-03 DIAGNOSIS — B181 Chronic viral hepatitis B without delta-agent: Secondary | ICD-10-CM | POA: Insufficient documentation

## 2017-04-03 DIAGNOSIS — R2231 Localized swelling, mass and lump, right upper limb: Secondary | ICD-10-CM | POA: Diagnosis present

## 2017-04-03 DIAGNOSIS — D169 Benign neoplasm of bone and articular cartilage, unspecified: Secondary | ICD-10-CM | POA: Insufficient documentation

## 2017-04-03 HISTORY — PX: EXCISION METACARPAL MASS: SHX6372

## 2017-04-03 HISTORY — DX: Chronic viral hepatitis B without delta-agent: B18.1

## 2017-04-03 HISTORY — DX: Localized swelling, mass and lump, right upper limb: R22.31

## 2017-04-03 SURGERY — EXCISION METACARPAL MASS
Anesthesia: General | Site: Hand | Laterality: Right

## 2017-04-03 MED ORDER — PROPOFOL 10 MG/ML IV BOLUS
INTRAVENOUS | Status: DC | PRN
  Administered 2017-04-03: 200 mg via INTRAVENOUS

## 2017-04-03 MED ORDER — ONDANSETRON HCL 4 MG/2ML IJ SOLN
INTRAMUSCULAR | Status: DC | PRN
  Administered 2017-04-03: 4 mg via INTRAVENOUS

## 2017-04-03 MED ORDER — CEFAZOLIN SODIUM-DEXTROSE 2-4 GM/100ML-% IV SOLN
2.0000 g | INTRAVENOUS | Status: AC
  Administered 2017-04-03: 2 g via INTRAVENOUS

## 2017-04-03 MED ORDER — MIDAZOLAM HCL 2 MG/2ML IJ SOLN
1.0000 mg | INTRAMUSCULAR | Status: DC | PRN
Start: 2017-04-03 — End: 2017-04-03
  Administered 2017-04-03: 2 mg via INTRAVENOUS

## 2017-04-03 MED ORDER — MIDAZOLAM HCL 2 MG/2ML IJ SOLN
INTRAMUSCULAR | Status: AC
  Filled 2017-04-03: qty 2

## 2017-04-03 MED ORDER — SCOPOLAMINE 1 MG/3DAYS TD PT72: 1.0000 | MEDICATED_PATCH | Freq: Once | TRANSDERMAL | Status: DC | PRN

## 2017-04-03 MED ORDER — BUPIVACAINE-EPINEPHRINE (PF) 0.25% -1:200000 IJ SOLN
INTRAMUSCULAR | Status: DC | PRN
  Administered 2017-04-03: 20 mL via PERINEURAL

## 2017-04-03 MED ORDER — MEPERIDINE HCL 25 MG/ML IJ SOLN: 6.2500 mg | INTRAMUSCULAR | Status: DC | PRN

## 2017-04-03 MED ORDER — FENTANYL CITRATE (PF) 100 MCG/2ML IJ SOLN
50.0000 ug | INTRAMUSCULAR | Status: DC | PRN
  Administered 2017-04-03: 100 ug via INTRAVENOUS

## 2017-04-03 MED ORDER — EPHEDRINE 5 MG/ML INJ
INTRAVENOUS | Status: AC
  Filled 2017-04-03: qty 10

## 2017-04-03 MED ORDER — ONDANSETRON HCL 4 MG/2ML IJ SOLN
INTRAMUSCULAR | Status: AC
  Filled 2017-04-03: qty 4

## 2017-04-03 MED ORDER — ACETAMINOPHEN 325 MG PO TABS: 650.0000 mg | ORAL_TABLET | Freq: Four times a day (QID) | ORAL | Status: DC | PRN

## 2017-04-03 MED ORDER — LACTATED RINGERS IV SOLN
INTRAVENOUS | Status: DC
  Administered 2017-04-03: 11:00:00 via INTRAVENOUS
  Administered 2017-04-03: 10 mL/h via INTRAVENOUS

## 2017-04-03 MED ORDER — OXYCODONE HCL 5 MG PO TABS
5.0000 mg | ORAL_TABLET | Freq: Four times a day (QID) | ORAL | 0 refills | Status: DC | PRN
Start: 2017-04-03 — End: 2017-04-21

## 2017-04-03 MED ORDER — FENTANYL CITRATE (PF) 100 MCG/2ML IJ SOLN
INTRAMUSCULAR | Status: AC
  Filled 2017-04-03: qty 2

## 2017-04-03 MED ORDER — FENTANYL CITRATE (PF) 100 MCG/2ML IJ SOLN: 25.0000 ug | INTRAMUSCULAR | Status: DC | PRN

## 2017-04-03 MED ORDER — CEFAZOLIN SODIUM-DEXTROSE 2-4 GM/100ML-% IV SOLN
INTRAVENOUS | Status: AC
  Filled 2017-04-03: qty 100

## 2017-04-03 MED ORDER — DEXAMETHASONE SODIUM PHOSPHATE 10 MG/ML IJ SOLN
INTRAMUSCULAR | Status: DC | PRN
  Administered 2017-04-03: 10 mg via INTRAVENOUS

## 2017-04-03 MED ORDER — LIDOCAINE HCL (CARDIAC) 20 MG/ML IV SOLN
INTRAVENOUS | Status: DC | PRN
  Administered 2017-04-03: 6 mg via INTRAVENOUS

## 2017-04-03 MED ORDER — LACTATED RINGERS IV SOLN: INTRAVENOUS | Status: DC

## 2017-04-03 MED ORDER — IBUPROFEN 200 MG PO TABS: 600.0000 mg | ORAL_TABLET | Freq: Four times a day (QID) | ORAL | Status: DC | PRN

## 2017-04-03 SURGICAL SUPPLY — 47 items
BANDAGE COBAN STERILE 2 (GAUZE/BANDAGES/DRESSINGS) IMPLANT
BLADE MINI RND TIP GREEN BEAV (BLADE) IMPLANT
BLADE SURG 15 STRL LF DISP TIS (BLADE) ×1 IMPLANT
BLADE SURG 15 STRL SS (BLADE) ×2
BNDG COHESIVE 1X5 TAN STRL LF (GAUZE/BANDAGES/DRESSINGS) IMPLANT
BNDG COHESIVE 4X5 TAN STRL (GAUZE/BANDAGES/DRESSINGS) ×3 IMPLANT
BNDG CONFORM 2 STRL LF (GAUZE/BANDAGES/DRESSINGS) IMPLANT
BNDG ESMARK 4X9 LF (GAUZE/BANDAGES/DRESSINGS) IMPLANT
BNDG GAUZE 1X2.1 STRL (MISCELLANEOUS) IMPLANT
BNDG GAUZE ELAST 4 BULKY (GAUZE/BANDAGES/DRESSINGS) ×3 IMPLANT
CHLORAPREP W/TINT 26ML (MISCELLANEOUS) ×3 IMPLANT
CORD BIPOLAR FORCEPS 12FT (ELECTRODE) IMPLANT
COVER BACK TABLE 60X90IN (DRAPES) ×3 IMPLANT
COVER MAYO STAND STRL (DRAPES) ×3 IMPLANT
CUFF TOURNIQUET SINGLE 18IN (TOURNIQUET CUFF) ×3 IMPLANT
DRAIN PENROSE 1/2X12 LTX STRL (WOUND CARE) IMPLANT
DRAPE EXTREMITY T 121X128X90 (DRAPE) ×3 IMPLANT
DRAPE SURG 17X23 STRL (DRAPES) ×3 IMPLANT
DRSG EMULSION OIL 3X3 NADH (GAUZE/BANDAGES/DRESSINGS) ×3 IMPLANT
GAUZE SPONGE 4X4 12PLY STRL (GAUZE/BANDAGES/DRESSINGS) ×3 IMPLANT
GAUZE SPONGE 4X4 12PLY STRL LF (GAUZE/BANDAGES/DRESSINGS) ×3 IMPLANT
GLOVE BIO SURGEON STRL SZ7.5 (GLOVE) ×3 IMPLANT
GLOVE BIOGEL PI IND STRL 7.0 (GLOVE) ×1 IMPLANT
GLOVE BIOGEL PI IND STRL 8 (GLOVE) ×1 IMPLANT
GLOVE BIOGEL PI INDICATOR 7.0 (GLOVE) ×2
GLOVE BIOGEL PI INDICATOR 8 (GLOVE) ×2
GLOVE ECLIPSE 6.5 STRL STRAW (GLOVE) ×3 IMPLANT
GOWN STRL REUS W/ TWL LRG LVL3 (GOWN DISPOSABLE) ×2 IMPLANT
GOWN STRL REUS W/TWL LRG LVL3 (GOWN DISPOSABLE) ×4
GOWN STRL REUS W/TWL XL LVL3 (GOWN DISPOSABLE) ×3 IMPLANT
NDL SAFETY ECLIPSE 18X1.5 (NEEDLE) IMPLANT
NEEDLE HYPO 18GX1.5 SHARP (NEEDLE)
NEEDLE HYPO 25X1 1.5 SAFETY (NEEDLE) IMPLANT
NS IRRIG 1000ML POUR BTL (IV SOLUTION) ×3 IMPLANT
PACK BASIN DAY SURGERY FS (CUSTOM PROCEDURE TRAY) ×3 IMPLANT
PADDING CAST ABS 4INX4YD NS (CAST SUPPLIES)
PADDING CAST ABS COTTON 4X4 ST (CAST SUPPLIES) IMPLANT
RUBBERBAND STERILE (MISCELLANEOUS) IMPLANT
STOCKINETTE 6  STRL (DRAPES) ×2
STOCKINETTE 6 STRL (DRAPES) ×1 IMPLANT
SUT VICRYL RAPIDE 4-0 (SUTURE) IMPLANT
SUT VICRYL RAPIDE 4/0 PS 2 (SUTURE) IMPLANT
SYR 10ML LL (SYRINGE) IMPLANT
SYR BULB 3OZ (MISCELLANEOUS) ×3 IMPLANT
TOWEL OR 17X24 6PK STRL BLUE (TOWEL DISPOSABLE) ×3 IMPLANT
TOWEL OR NON WOVEN STRL DISP B (DISPOSABLE) ×3 IMPLANT
UNDERPAD 30X30 (UNDERPADS AND DIAPERS) ×3 IMPLANT

## 2017-04-03 NOTE — Anesthesia Postprocedure Evaluation (Signed)
Anesthesia Post Note  Patient: Caleb Chandler  Procedure(s) Performed: RIGHT HAND MASS EXCISION (Right Hand)     Patient location during evaluation: PACU Anesthesia Type: General Level of consciousness: awake and alert and oriented Pain management: pain level controlled Vital Signs Assessment: post-procedure vital signs reviewed and stable Respiratory status: spontaneous breathing, nonlabored ventilation and respiratory function stable Cardiovascular status: blood pressure returned to baseline and stable Postop Assessment: no apparent nausea or vomiting Anesthetic complications: no    Last Vitals:  Vitals:   04/03/17 1200 04/03/17 1215  BP: 110/81 121/84  Pulse: 76 72  Resp: 14 13  Temp:    SpO2: 100% 100%    Last Pain:  Vitals:   04/03/17 1134  TempSrc:   PainSc: Asleep                 Cuca Benassi A.

## 2017-04-03 NOTE — Anesthesia Procedure Notes (Signed)
Procedure Name: LMA Insertion Date/Time: 04/03/2017 10:54 AM Performed by: Signe Colt, CRNA Pre-anesthesia Checklist: Patient identified, Emergency Drugs available, Suction available and Patient being monitored Patient Re-evaluated:Patient Re-evaluated prior to induction Oxygen Delivery Method: Circle system utilized Preoxygenation: Pre-oxygenation with 100% oxygen Induction Type: IV induction Ventilation: Mask ventilation without difficulty LMA: LMA inserted LMA Size: 4.0 Number of attempts: 1 Airway Equipment and Method: Bite block Placement Confirmation: positive ETCO2 Tube secured with: Tape Dental Injury: Teeth and Oropharynx as per pre-operative assessment

## 2017-04-03 NOTE — Discharge Instructions (Signed)
Discharge Instructions   You have a light dressing on your hand.  You may begin gentle motion of your fingers and hand immediately, but you should not do any heavy lifting or gripping.  Elevate your hand to reduce pain & swelling of the digits.  Ice over the operative site may be helpful to reduce pain & swelling.  DO NOT USE HEAT. Pain medicine has been prescribed for you.  Take over the counter tylenol 650 mg and ibuprofen 600 mg every 6 hours, together. Take the pain medicine prescribed only as a rescue medicine for severe pain. Leave the dressing in place until the third day after your surgery and then remove it, leaving it open to air.  After the bandage has been removed you may shower, regularly washing the incision and letting the water run over it, but not submerging it (no swimming, soaking it in dishwater, etc.) You may drive a car when you are off of prescription pain medications and can safely control your vehicle with both hands. We will address whether therapy will be required or not when you return to the office. You may have already made your follow-up appointment when we completed your preop visit.  If not, please call our office today or the next business day to make your return appointment for 10-15 days after surgery.   Please call [phone] during normal business hours or [phone] after hours for any problems. Including the following:  - excessive redness of the incisions - drainage for more than 4 days - fever of more than 101.5 F  *Please note that pain medications will not be refilled after hours or on weekends.    Post Anesthesia Home Care Instructions  Activity: Get plenty of rest for the remainder of the day. A responsible individual must stay with you for 24 hours following the procedure.  For the next 24 hours, DO NOT: -Drive a car -Paediatric nurse -Drink alcoholic beverages -Take any medication unless instructed by your physician -Make any legal  decisions or sign important papers.  Meals: Start with liquid foods such as gelatin or soup. Progress to regular foods as tolerated. Avoid greasy, spicy, heavy foods. If nausea and/or vomiting occur, drink only clear liquids until the nausea and/or vomiting subsides. Call your physician if vomiting continues.  Special Instructions/Symptoms: Your throat may feel dry or sore from the anesthesia or the breathing tube placed in your throat during surgery. If this causes discomfort, gargle with warm salt water. The discomfort should disappear within 24 hours.  If you had a scopolamine patch placed behind your ear for the management of post- operative nausea and/or vomiting:  1. The medication in the patch is effective for 72 hours, after which it should be removed.  Wrap patch in a tissue and discard in the trash. Wash hands thoroughly with soap and water. 2. You may remove the patch earlier than 72 hours if you experience unpleasant side effects which may include dry mouth, dizziness or visual disturbances. 3. Avoid touching the patch. Wash your hands with soap and water after contact with the patch.     Post Anesthesia Home Care Instructions  Activity: Get plenty of rest for the remainder of the day. A responsible individual must stay with you for 24 hours following the procedure.  For the next 24 hours, DO NOT: -Drive a car -Paediatric nurse -Drink alcoholic beverages -Take any medication unless instructed by your physician -Make any legal decisions or sign important papers.  Meals: Start with  liquid foods such as gelatin or soup. Progress to regular foods as tolerated. Avoid greasy, spicy, heavy foods. If nausea and/or vomiting occur, drink only clear liquids until the nausea and/or vomiting subsides. Call your physician if vomiting continues.  Special Instructions/Symptoms: Your throat may feel dry or sore from the anesthesia or the breathing tube placed in your throat during surgery.  If this causes discomfort, gargle with warm salt water. The discomfort should disappear within 24 hours.  If you had a scopolamine patch placed behind your ear for the management of post- operative nausea and/or vomiting:  1. The medication in the patch is effective for 72 hours, after which it should be removed.  Wrap patch in a tissue and discard in the trash. Wash hands thoroughly with soap and water. 2. You may remove the patch earlier than 72 hours if you experience unpleasant side effects which may include dry mouth, dizziness or visual disturbances. 3. Avoid touching the patch. Wash your hands with soap and water after contact with the patch.

## 2017-04-03 NOTE — Anesthesia Preprocedure Evaluation (Signed)
Anesthesia Evaluation  Patient identified by MRN, date of birth, ID band Patient awake    Reviewed: Allergy & Precautions, NPO status , Patient's Chart, lab work & pertinent test results  Airway Mallampati: II  TM Distance: >3 FB Neck ROM: Full    Dental no notable dental hx. (+) Teeth Intact   Pulmonary Current Smoker,    Pulmonary exam normal breath sounds clear to auscultation       Cardiovascular negative cardio ROS Normal cardiovascular exam Rhythm:Regular Rate:Normal     Neuro/Psych negative neurological ROS  negative psych ROS   GI/Hepatic negative GI ROS, (+) Hepatitis -, B  Endo/Other  Obesity  Renal/GU negative Renal ROS  negative genitourinary   Musculoskeletal Right Hand mass   Abdominal   Peds  Hematology negative hematology ROS (+)   Anesthesia Other Findings   Reproductive/Obstetrics                             Anesthesia Physical Anesthesia Plan  ASA: II  Anesthesia Plan: General   Post-op Pain Management:    Induction: Intravenous  PONV Risk Score and Plan: 1 and Ondansetron, Midazolam and Treatment may vary due to age or medical condition  Airway Management Planned: LMA  Additional Equipment:   Intra-op Plan:   Post-operative Plan: Extubation in OR  Informed Consent: I have reviewed the patients History and Physical, chart, labs and discussed the procedure including the risks, benefits and alternatives for the proposed anesthesia with the patient or authorized representative who has indicated his/her understanding and acceptance.   Dental advisory given  Plan Discussed with: CRNA, Anesthesiologist and Surgeon  Anesthesia Plan Comments:         Anesthesia Quick Evaluation

## 2017-04-03 NOTE — Interval H&P Note (Signed)
History and Physical Interval Note:  04/03/2017 10:08 AM  Caleb Chandler  has presented today for surgery, with the diagnosis of RIGHT HAND MASS R22.31  The various methods of treatment have been discussed with the patient and family. After consideration of risks, benefits and other options for treatment, the patient has consented to  Procedure(s): RIGHT HAND MASS EXCISION (Right) as a surgical intervention .  The patient's history has been reviewed, patient examined, no change in status, stable for surgery.  I have reviewed the patient's chart and labs.  Questions were answered to the patient's satisfaction.     Jolyn Nap

## 2017-04-03 NOTE — Transfer of Care (Signed)
Immediate Anesthesia Transfer of Care Note  Patient: Caleb Chandler  Procedure(s) Performed: RIGHT HAND MASS EXCISION (Right Hand)  Patient Location: PACU  Anesthesia Type:General  Level of Consciousness: awake and patient cooperative  Airway & Oxygen Therapy: Patient Spontanous Breathing and Patient connected to face mask oxygen  Post-op Assessment: Report given to RN and Post -op Vital signs reviewed and stable  Post vital signs: Reviewed and stable  Last Vitals:  Vitals:   04/03/17 1135 04/03/17 1136  BP:    Pulse: 81 81  Resp: 13 12  Temp:    SpO2: 100% 100%    Last Pain:  Vitals:   04/03/17 0925  TempSrc: Oral  PainSc: 0-No pain         Complications: No apparent anesthesia complications

## 2017-04-03 NOTE — Op Note (Signed)
04/03/2017  10:15 AM  PATIENT:  Caleb Chandler  36 y.o. male  PRE-OPERATIVE DIAGNOSIS:  Right hand mass  POST-OPERATIVE DIAGNOSIS:  Same  PROCEDURE:  Excision of right hand mass  SURGEON: Rayvon Char. Grandville Silos, MD  PHYSICIAN ASSISTANT: Morley Kos, OPA-C  ANESTHESIA:  general  SPECIMENS:  None  DRAINS:   None  EBL:  less than 50 mL  PREOPERATIVE INDICATIONS:  Caleb Chandler is a  36 y.o. male with a long-standing right hand mass  The risks benefits and alternatives were discussed with the patient preoperatively including but not limited to the risks of infection, bleeding, nerve injury, cardiopulmonary complications, the need for revision surgery, among others, and the patient verbalized understanding and consented to proceed.  OPERATIVE IMPLANTS: none  OPERATIVE PROCEDURE:  After receiving prophylactic antibiotics, the patient was escorted to the operative theatre and placed in a supine position.  A surgical "time-out" was performed during which the planned procedure, proposed operative site, and the correct patient identity were compared to the operative consent and agreement confirmed by the circulating nurse according to current facility policy.  Following application of a tourniquet to the operative extremity, the exposed skin was prepped with Chloraprep and draped in the usual sterile fashion.  The limb was exsanguinated with an Esmarch bandage and the tourniquet inflated to approximately 181mmHg higher than systolic BP.Quarter percent Marcaine was instilled at the wrist for median and ulnar nerve block.  There was one lobule of the mass that really had the skin distended over top of it.  In the clinic, I was concerned that the skin was perhaps not mobile over it.  An incision was marked where this would be elliptically excised, but I started by first opening just one side of the ellipse extended proximally and distally in an oblique fashion.  The skin flaps were reflected.  It  did appear that the mass was going to be separated from the skin, leaving the distended skin over the mass intact and viable.  The mass was heavily encapsulated and firm.  It was multilobulated, almost like firm cauliflower.  Meticulous marginal resection was performed, with the mass remaining well encapsulated.  There were no neurovascular structures crossing within the mass itself, all essentially deep to it.  The mass was fairly large, several centimeters in its longest dimension.  It was passed off for pathological evaluation.  The wound was copiously irrigated and the tourniquet released.  Some additional hemostasis was obtained.  The skin that had been stretched over 1 of the projections of the mass was partially excised and the skin was all closed with 4-0 Vicryl Rapide interrupted sutures.  A bulky hand dressing was applied and he was taken to the recovery room in stable condition, breathing spontaneously.  DISPOSITION: He will be discharged home today with typical instructions, returning in 10-15 days for reevaluation.

## 2017-04-04 ENCOUNTER — Encounter (HOSPITAL_BASED_OUTPATIENT_CLINIC_OR_DEPARTMENT_OTHER): Payer: Self-pay | Admitting: Orthopedic Surgery

## 2017-04-21 ENCOUNTER — Encounter (INDEPENDENT_AMBULATORY_CARE_PROVIDER_SITE_OTHER): Payer: Self-pay | Admitting: Physician Assistant

## 2017-04-21 ENCOUNTER — Ambulatory Visit (INDEPENDENT_AMBULATORY_CARE_PROVIDER_SITE_OTHER): Payer: Medicaid Other | Admitting: Physician Assistant

## 2017-04-21 ENCOUNTER — Other Ambulatory Visit: Payer: Self-pay

## 2017-04-21 VITALS — BP 114/74 | HR 61 | Temp 98.1°F | Wt 191.2 lb

## 2017-04-21 DIAGNOSIS — T148XXD Other injury of unspecified body region, subsequent encounter: Secondary | ICD-10-CM

## 2017-04-21 DIAGNOSIS — R319 Hematuria, unspecified: Secondary | ICD-10-CM | POA: Diagnosis not present

## 2017-04-21 DIAGNOSIS — D696 Thrombocytopenia, unspecified: Secondary | ICD-10-CM | POA: Diagnosis not present

## 2017-04-21 DIAGNOSIS — L739 Follicular disorder, unspecified: Secondary | ICD-10-CM

## 2017-04-21 DIAGNOSIS — T148XXA Other injury of unspecified body region, initial encounter: Secondary | ICD-10-CM

## 2017-04-21 LAB — POCT URINALYSIS DIPSTICK
BILIRUBIN UA: NEGATIVE
GLUCOSE UA: NEGATIVE
KETONES UA: NEGATIVE
Leukocytes, UA: NEGATIVE
Nitrite, UA: NEGATIVE
PH UA: 5.5 (ref 5.0–8.0)
Protein, UA: NEGATIVE
RBC UA: NEGATIVE
SPEC GRAV UA: 1.02 (ref 1.010–1.025)
Urobilinogen, UA: 0.2 E.U./dL

## 2017-04-21 MED ORDER — NAPROXEN 500 MG PO TABS: 500.0000 mg | ORAL_TABLET | Freq: Two times a day (BID) | ORAL | 0 refills | Status: DC

## 2017-04-21 MED ORDER — CLINDAMYCIN PHOSPHATE 1 % EX GEL: Freq: Two times a day (BID) | CUTANEOUS | 0 refills | Status: DC

## 2017-04-21 MED ORDER — CYCLOBENZAPRINE HCL 10 MG PO TABS: 10.0000 mg | ORAL_TABLET | Freq: Three times a day (TID) | ORAL | 0 refills | Status: DC | PRN

## 2017-04-21 NOTE — Patient Instructions (Signed)

## 2017-04-21 NOTE — Progress Notes (Signed)
Subjective:  Patient ID: Caleb Chandler, male    DOB: 1980/09/08  Age: 36 y.o. MRN: [id number]  CC: f/u   HPI  Caleb Chandler a 36 y.o.malewith a medical history of chronic Hepatitis B infection presents to f/u on hematuria and thrombocytopenia. 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 His incision is healing well and without signs of infection.     There is no frank hematuria. No fatigue, lightheadedness, palpitations, chest pain, SOB, HA, f/c/nv/, rash, petechiae or abdominal pain.     Outpatient Medications Prior to Visit  Medication Sig Dispense Refill  . acetaminophen (TYLENOL) 325 MG tablet Take 2 tablets (650 mg total) every 6 (six) hours as needed by mouth for mild pain or moderate pain. (Patient not taking: Reported on 04/21/2017)    . ibuprofen (ADVIL) 200 MG tablet Take 3 tablets (600 mg total) every 6 (six) hours as needed by mouth for mild pain or moderate pain. (Patient not taking: Reported on 04/21/2017)    . oxyCODONE (ROXICODONE) 5 MG immediate release tablet Take 1 tablet (5 mg total) every 6 (six) hours as needed by mouth for severe pain. (Patient not taking: Reported on 04/21/2017) 28 tablet 0   No facility-administered medications prior to visit.      ROS Review of Systems  Constitutional: Negative for chills, fever and malaise/fatigue.  Eyes: Negative for blurred vision.  Respiratory: Negative for shortness of breath.   Cardiovascular: Negative for chest pain and palpitations.  Gastrointestinal: Negative for abdominal pain and nausea.  Genitourinary: Negative for dysuria and hematuria.  Musculoskeletal: Negative for joint pain and myalgias.  Skin: Negative for rash.  Neurological: Negative for tingling and headaches.  Psychiatric/Behavioral: Negative for depression. The patient is not nervous/anxious.     Objective:  BP 114/74 (BP Location: Left Arm, Patient Position: Sitting, Cuff Size: Normal)   Pulse 61    Temp 98.1 F (36.7 C) (Oral)   Wt 191 lb 3.2 oz (86.7 kg)   SpO2 97%   BMI 30.86 kg/m   BP/Weight 04/21/2017 04/03/2017 94/08/9673  Systolic BP 916 384 665  Diastolic BP 74 81 78  Wt. (Lbs) 191.2 192 193  BMI 30.86 30.99 31.15      Physical Exam  Constitutional: He is oriented to person, place, and time.  Well developed, well nourished, NAD, polite  HENT:  Head: Normocephalic and atraumatic.  Eyes: No scleral icterus.  Cardiovascular: Normal rate, regular rhythm and normal heart sounds.  Pulmonary/Chest: Effort normal and breath sounds normal. No respiratory distress. He has no wheezes.  Musculoskeletal: He exhibits no edema.  Neurological: He is alert and oriented to person, place, and time. No cranial nerve deficit. Coordination normal.  Skin: Skin is warm and dry. No rash noted. No erythema. No pallor.  Incision on right hand dry and clean  Psychiatric: He has a normal mood and affect. His behavior is normal. Thought content normal.  Vitals reviewed.       Assessment & Plan:    1. Muscle strain - Refill naproxen (NAPROSYN) 500 MG tablet; Take 1 tablet (500 mg total) by mouth 2 (two) times daily with a meal.  Dispense: 30 tablet; Refill: 0 - Refill cyclobenzaprine (FLEXERIL) 10 MG tablet; Take 1 tablet (10 mg total) by mouth 3 (three) times daily as needed for muscle spasms.  Dispense: 30 tablet; Refill: 0  2. Thrombocytopenia (HCC) - CBC with Differential  3. Hematuria, unspecified type - Urinalysis Dipstick negative for blood.  4. Folliculitis - Begin clindamycin (CLINDAGEL) 1 % gel; Apply topically 2 (two) times daily.  Dispense: 30 g; Refill: 0   Meds ordered this encounter  Medications  . naproxen (NAPROSYN) 500 MG tablet    Sig: Take 1 tablet (500 mg total) by mouth 2 (two) times daily with a meal.    Dispense:  30 tablet    Refill:  0    Order Specific Question:   Supervising Provider    Answer:   Tresa Garter [id number]  . cyclobenzaprine  (FLEXERIL) 10 MG tablet    Sig: Take 1 tablet (10 mg total) by mouth 3 (three) times daily as needed for muscle spasms.    Dispense:  30 tablet    Refill:  0    Order Specific Question:   Supervising Provider    Answer:   Tresa Garter [id number]  . clindamycin (CLINDAGEL) 1 % gel    Sig: Apply topically 2 (two) times daily.    Dispense:  30 g    Refill:  0    Order Specific Question:   Supervising Provider    Answer:   Tresa Garter [id number]    Follow-up: Return if symptoms worsen or fail to improve.   Clent Demark PA

## 2017-04-22 LAB — CBC WITH DIFFERENTIAL/PLATELET
BASOS ABS: 0 10*3/uL (ref 0.0–0.2)
Basos: 1 %
EOS (ABSOLUTE): 0.2 10*3/uL (ref 0.0–0.4)
EOS: 3 %
HEMATOCRIT: 44 % (ref 37.5–51.0)
HEMOGLOBIN: 15.8 g/dL (ref 13.0–17.7)
IMMATURE GRANS (ABS): 0 10*3/uL (ref 0.0–0.1)
Immature Granulocytes: 0 %
LYMPHS ABS: 2.4 10*3/uL (ref 0.7–3.1)
LYMPHS: 48 %
MCH: 31.3 pg (ref 26.6–33.0)
MCHC: 35.9 g/dL — AB (ref 31.5–35.7)
MCV: 87 fL (ref 79–97)
MONOCYTES: 12 %
Monocytes Absolute: 0.6 10*3/uL (ref 0.1–0.9)
Neutrophils Absolute: 1.8 10*3/uL (ref 1.4–7.0)
Neutrophils: 36 %
Platelets: 142 10*3/uL — ABNORMAL LOW (ref 150–379)
RBC: 5.05 x10E6/uL (ref 4.14–5.80)
RDW: 13.7 % (ref 12.3–15.4)
WBC: 5 10*3/uL (ref 3.4–10.8)

## 2017-04-26 ENCOUNTER — Telehealth (INDEPENDENT_AMBULATORY_CARE_PROVIDER_SITE_OTHER): Payer: Self-pay

## 2017-04-26 NOTE — Telephone Encounter (Signed)
-----   Message from Clent Demark, PA-C sent at 04/26/2017 11:30 AM EST ----- Results are normal/unremarkable.

## 2017-04-26 NOTE — Telephone Encounter (Signed)
Left message asking patient to call office. Tempestt S Roberts, CMA  

## 2017-04-27 ENCOUNTER — Telehealth (INDEPENDENT_AMBULATORY_CARE_PROVIDER_SITE_OTHER): Payer: Self-pay

## 2017-04-27 NOTE — Telephone Encounter (Signed)
-----   Message from Clent Demark, PA-C sent at 04/26/2017 11:30 AM EST ----- Results are normal/unremarkable.

## 2017-04-27 NOTE — Telephone Encounter (Signed)
Patient aware of normal lab results. [name], CMA

## 2017-09-05 ENCOUNTER — Other Ambulatory Visit: Payer: Self-pay

## 2017-09-05 DIAGNOSIS — B181 Chronic viral hepatitis B without delta-agent: Secondary | ICD-10-CM

## 2017-09-05 LAB — COMPREHENSIVE METABOLIC PANEL
AG Ratio: 1.4 (calc) (ref 1.0–2.5)
ALBUMIN MSPROF: 4.4 g/dL (ref 3.6–5.1)
ALKALINE PHOSPHATASE (APISO): 55 U/L (ref 40–115)
ALT: 26 U/L (ref 9–46)
AST: 24 U/L (ref 10–40)
BILIRUBIN TOTAL: 0.6 mg/dL (ref 0.2–1.2)
BUN: 11 mg/dL (ref 7–25)
CALCIUM: 9.7 mg/dL (ref 8.6–10.3)
CO2: 27 mmol/L (ref 20–32)
Chloride: 104 mmol/L (ref 98–110)
Creat: 0.82 mg/dL (ref 0.60–1.35)
GLOBULIN: 3.1 g/dL (ref 1.9–3.7)
Glucose, Bld: 96 mg/dL (ref 65–99)
POTASSIUM: 4 mmol/L (ref 3.5–5.3)
Sodium: 138 mmol/L (ref 135–146)
Total Protein: 7.5 g/dL (ref 6.1–8.1)

## 2017-09-06 LAB — HEPATITIS B E ANTIGEN: Hep B E Ag: NONREACTIVE

## 2017-09-07 LAB — HEPATITIS B DNA, ULTRAQUANTITATIVE, PCR
HEPATITIS B DNA (CALC): 3.43 {Log_IU}/mL — AB
HEPATITIS B DNA: 2700 [IU]/mL — AB

## 2017-09-19 ENCOUNTER — Ambulatory Visit: Payer: Medicaid Other | Admitting: Internal Medicine

## 2017-09-26 ENCOUNTER — Ambulatory Visit (INDEPENDENT_AMBULATORY_CARE_PROVIDER_SITE_OTHER): Payer: No Typology Code available for payment source | Admitting: Internal Medicine

## 2017-09-26 ENCOUNTER — Encounter: Payer: Self-pay | Admitting: Internal Medicine

## 2017-09-26 DIAGNOSIS — B181 Chronic viral hepatitis B without delta-agent: Secondary | ICD-10-CM | POA: Diagnosis not present

## 2017-09-26 NOTE — Progress Notes (Signed)
[facility]  Patient Active Problem List   Diagnosis Date Noted  . Chronic viral hepatitis B without delta-agent ([place]) 02/21/2017    Patient's Medications  New Prescriptions   No medications on file  Previous Medications   CLINDAMYCIN (CLINDAGEL) 1 % GEL    Apply topically 2 (two) times daily.   CYCLOBENZAPRINE (FLEXERIL) 10 MG TABLET    Take 1 tablet (10 mg total) by mouth 3 (three) times daily as needed for muscle spasms.   NAPROXEN (NAPROSYN) 500 MG TABLET    Take 1 tablet (500 mg total) by mouth 2 (two) times daily with a meal.  Modified Medications   No medications on file  Discontinued Medications   No medications on file    Subjective: Roulf is in for his routine follow-up visit.  He is feeling well other than some recurrent, mild right lower back pain that he has had for many years since falling out of a tree.  He is scheduled to visit with his PCP to evaluate this.  He is on no medications.  He is hoping that his fiance can come to the [place] and they can be married by end of the year.  He has asked her to get vaccinated against hepatitis B.  They have never been sexually active.  Review of Systems: Review of Systems  Constitutional: Negative for chills, diaphoresis, fever and weight loss.  Gastrointestinal: Negative for abdominal pain, diarrhea, nausea and vomiting.  Musculoskeletal: Positive for back pain.  Skin: Negative for rash.    Past Medical History:  Diagnosis Date  . Chronic viral hepatitis B without delta-agent ([place])   . Mass of hand, right 03/2017    Social History   Tobacco Use  . Smoking status: Current Some Day Smoker  . Smokeless tobacco: Never Used  . Tobacco comment: uses hookah  Substance Use Topics  . Alcohol use: No  . Drug use: No    No family history on file.  No Known Allergies  Objective: Vitals:   09/26/17 0907  BP: 124/83  Pulse: (!) 55  Temp: 98.2 F (36.8 C)  TempSrc: Oral   Weight: 183 lb (83 kg)   Body mass index is 29.54 kg/m.  Physical Exam  Constitutional: He is oriented to person, place, and time.  He is in good spirits.  Abdominal: Soft. He exhibits no distension and no mass. There is no tenderness.  Neurological: He is alert and oriented to person, place, and time.  Skin: No rash noted.  Psychiatric: He has a normal mood and affect.    Lab Results 09/05/2017 Liver enzymes normal Hepatitis B e antigen negative It is B DNA viral load 2700 IU/ml    Problem List Items Addressed This Visit      Unprioritized   Chronic viral hepatitis B without delta-agent ([place])    He has asymptomatic chronic hepatitis B.  He has very low level viral activation, normal liver enzymes and no evidence of liver fibrosis.  There is no indication for him to restart antiviral therapy at this time.  He will follow-up after repeat lab work in 6 months.      Relevant Orders   Hepatitis B DNA, ultraquantitative, PCR   Hepatitis B e antibody   Hepatitis B e antigen   Comprehensive metabolic panel       Michel Bickers, MD [facility] [phone] pager   [phone]  cell 09/26/2017, 9:21 AM

## 2017-09-26 NOTE — Assessment & Plan Note (Signed)
He has asymptomatic chronic hepatitis B.  He has very low level viral activation, normal liver enzymes and no evidence of liver fibrosis.  There is no indication for him to restart antiviral therapy at this time.  He will follow-up after repeat lab work in 6 months.

## 2017-09-28 ENCOUNTER — Ambulatory Visit (INDEPENDENT_AMBULATORY_CARE_PROVIDER_SITE_OTHER): Payer: No Typology Code available for payment source | Admitting: Nurse Practitioner

## 2017-09-28 ENCOUNTER — Other Ambulatory Visit: Payer: Self-pay

## 2017-09-28 ENCOUNTER — Encounter (INDEPENDENT_AMBULATORY_CARE_PROVIDER_SITE_OTHER): Payer: Self-pay | Admitting: Nurse Practitioner

## 2017-09-28 VITALS — BP 120/72 | HR 63 | Temp 98.0°F | Ht 66.0 in | Wt 184.4 lb

## 2017-09-28 DIAGNOSIS — M545 Low back pain: Secondary | ICD-10-CM | POA: Diagnosis not present

## 2017-09-28 DIAGNOSIS — G8929 Other chronic pain: Secondary | ICD-10-CM

## 2017-09-28 MED ORDER — IBUPROFEN 800 MG PO TABS: 800.0000 mg | ORAL_TABLET | Freq: Three times a day (TID) | ORAL | 0 refills | Status: DC | PRN

## 2017-09-28 MED ORDER — METHOCARBAMOL 750 MG PO TABS: 750.0000 mg | ORAL_TABLET | Freq: Four times a day (QID) | ORAL | 1 refills | Status: DC

## 2017-09-28 NOTE — Patient Instructions (Signed)
Chronic Back Pain When back pain lasts longer than 3 months, it is called chronic back pain.The cause of your back pain may not be known. Some common causes include:  Wear and tear (degenerative disease) of the bones, ligaments, or disks in your back.  Inflammation and stiffness in your back (arthritis).  People who have chronic back pain often go through certain periods in which the pain is more intense (flare-ups). Many people can learn to manage the pain with home care. Follow these instructions at home: Pay attention to any changes in your symptoms. Take these actions to help with your pain: Activity  Avoid bending and activities that make the problem worse.  Do not sit or stand in one place for long periods of time.  Take brief periods of rest throughout the day. This will reduce your pain. Resting in a lying or standing position is usually better than sitting to rest.  When you are resting for longer periods, mix in some mild activity or stretching between periods of rest. This will help to prevent stiffness and pain.  Get regular exercise. Ask your health care provider what activities are safe for you.  Do not lift anything that is heavier than 10 lb (4.5 kg). Always use proper lifting technique, which includes: ? Bending your knees. ? Keeping the load close to your body. ? Avoiding twisting. Managing pain  If directed, apply ice to the painful area. Your health care provider may recommend applying ice during the first 24-48 hours after a flare-up begins. ? Put ice in a plastic bag. ? Place a towel between your skin and the bag. ? Leave the ice on for 20 minutes, 2-3 times per day.  After icing, apply heat to the affected area as often as told by your health care provider. Use the heat source that your health care provider recommends, such as a moist heat pack or a heating pad. ? Place a towel between your skin and the heat source. ? Leave the heat on for 20-30  minutes. ? Remove the heat if your skin turns bright red. This is especially important if you are unable to feel pain, heat, or cold. You may have a greater risk of getting burned.  Try soaking in a warm tub.  Take over-the-counter and prescription medicines only as told by your health care provider.  Keep all follow-up visits as told by your health care provider. This is important. Contact a health care provider if:  You have pain that is not relieved with rest or medicine. Get help right away if:  You have weakness or numbness in one or both of your legs or feet.  You have trouble controlling your bladder or your bowels.  You have nausea or vomiting.  You have pain in your abdomen.  You have shortness of breath or you faint. This information is not intended to replace advice given to you by your health care provider. Make sure you discuss any questions you have with your health care provider. Document Released: 06/09/2004 Document Revised: 09/10/2015 Document Reviewed: 10/20/2014 Elsevier Interactive Patient Education  2018 [employer].  What You Need to Know About Chronic Back Pain Long-term (chronic) back pain is back pain that lasts for 12 weeks or longer. It often affects the lower back and can range from mild to severe. Many people have back pain at some point in their lives. It can feel different to each person. It may feel like a muscle ache or a sharp,  stabbing pain. The pain often gets worse over time. It can be difficult to find the cause of chronic back pain. Treating chronic back pain often starts with rest and pain relief, followed by exercises (physical therapy) to strengthen the muscles that support your back. You may have to try different things to see what works best for you. If other treatments do not help, or if your pain is caused by a condition or an injury, you may need surgery. How can back pain affect me? Chronic back pain is uncomfortable and can make it  hard to do your usual daily activities. Chronic back pain can:  Cause numbness and tingling.  Come and go.  Get worse when you are sitting, standing, walking, bending, or lifting.  Affect you while you are active, at rest, or both.  Eventually make it hard to move around.  Occur with fever, weight loss, or difficulty urinating.  What are the benefits of treating back pain? Treating chronic back pain may:  Relieve pain.  Keep your pain from getting worse.  Make it easier for you to do your usual activities.  What are some steps I can take to decrease my back pain?  Take over-the-counter or prescription medicines only as told by your health care provider.  If directed, apply heat to the affected area. Use the heat source that your health care provider recommends, such as a moist heat pack or a heating pad. ? Place a towel between your skin and the heat source. ? Leave the heat on for 20-30 minutes. ? Remove the heat if your skin turns bright red. This is especially important if you are unable to feel pain, heat, or cold. You may have a greater risk of getting burned.  If directed, put ice on the affected area: ? Put ice in a plastic bag. ? Place a towel between your skin and the bag. ? Leave the ice on for 20 minutes, 2-3 times a day.  Get regular exercise as told by your health care provider to improve flexibility and strength.  Do not smoke.  Maintain a healthy weight.  When lifting objects: ? Keep your feet as far apart as your shoulders (shoulder-width apart) or farther apart. ? Tighten the muscles in your abdomen. ? Bend your knees and hips and keep your spine neutral. It is important to lift using the strength of your legs, not your back. Do not lock your knees straight out. ? Always ask for help to lift heavy or awkward objects. What can happen if my back pain goes untreated? Untreated back pain can:  Get worse over time.  Start to occur more often or at  different times, such as when you are resting.  Cause posture problems.  Make it hard to move around (limit mobility).  Where can I get support? Chronic back pain can be a frustrating condition to manage. It may help to talk with other people who are having a similar experience. Consider joining a support group for people dealing with chronic back pain. Ask your health care provider about support groups in your area. You can also find online and in-person support groups through:  The American Chronic Pain Association: DeluxeOption.si  The U.S. Pain Foundation: uspainfoundation.org/support-groups  Contact a health care provider if:  Your symptoms do not get better or they get worse.  You have severe back pain.  You have chronic back pain and a fever.  You lose weight without trying.  You have difficulty urinating.  You experience numbness or tingling.  You develop new pain after an injury. Summary  Chronic back pain is often treated with rest, pain relief, and physical therapy.  Get regular exercise to improve your strength and flexibility.  Put heat and ice on the affected areas as directed by your health care provider.  Chronic back pain can be challenging to live with. Joining a support group may help you manage your condition. This information is not intended to replace advice given to you by your health care provider. Make sure you discuss any questions you have with your health care provider. Document Released: 05/17/2015 Document Revised: 01/09/2016 Document Reviewed: 01/09/2016 Elsevier Interactive Patient Education  [employer].

## 2017-09-28 NOTE — Progress Notes (Signed)
Assessment & Plan:  Caleb Chandler was seen today for back pain.  Diagnoses and all orders for this visit:  Chronic right-sided low back pain without sciatica -     DG Lumbar Spine Complete; Future -     methocarbamol (ROBAXIN) 750 MG tablet; Take 1 tablet (750 mg total) by mouth 4 (four) times daily. -     ibuprofen (ADVIL,MOTRIN) 800 MG tablet; Take 1 tablet (800 mg total) by mouth every 8 (eight) hours as needed. Work on losing weight to help reduce back pain. May alternate with heat and ice application for pain relief. May also alternate acetaminophen and Ibuprofen as prescribed for back pain. Other alternatives include massage, acupuncture and water aerobics.  You must stay active and avoid a sedentary lifestyle.   Patient has been counseled on age-appropriate routine health concerns for screening and prevention. These are reviewed and up-to-date. Referrals have been placed accordingly. Immunizations are up-to-date or declined.    Subjective:   Chief Complaint  Patient presents with  . Back Pain   HPI Caleb Chandler 37 y.o. male presents to office today with complaints of back pain.     Back Pain Chronic and ongoing for several months now. He endorses falling out of a tree at the age of 71. He has always experienced minimal back pain which has worsened over the past 2 months. Pain is located in the right lower back (lumbar). Describes pain as pulling, sharp, stabbing. Aggravating factors: driving long distance for over and hour, bending, twisting, pulling, heavy lifting. He has been prescribed naproxen and flexeril in the past by his PCP for muscle strain which he states provided only minimal relief. He has seen a therapist at work who instructed him on back exercises to practice at home. He states the exercises made his back pain. Relieving factors: rest.  Rates pain 7/10. Pain is non radiating. Denies any urinary symptoms or constipation.     Review of Systems  Constitutional: Negative  for fever, malaise/fatigue and weight loss.  HENT: Negative.  Negative for nosebleeds.   Eyes: Negative.  Negative for blurred vision, double vision and photophobia.  Respiratory: Negative.  Negative for cough and shortness of breath.   Cardiovascular: Negative.  Negative for chest pain, palpitations and leg swelling.  Gastrointestinal: Negative.  Negative for heartburn, nausea and vomiting.  Musculoskeletal: Positive for back pain and myalgias. Negative for falls.       SEE HPI  Neurological: Negative.  Negative for dizziness, focal weakness, seizures and headaches.  Psychiatric/Behavioral: Negative.  Negative for suicidal ideas.    Past Medical History:  Diagnosis Date  . Chronic viral hepatitis B without delta-agent ([place])   . Mass of hand, right 03/2017    Past Surgical History:  Procedure Laterality Date  . EXCISION METACARPAL MASS Right 04/03/2017   Procedure: RIGHT HAND MASS EXCISION;  Surgeon: Milly Jakob, MD;  Location: [facility];  Service: Orthopedics;  Laterality: Right;  . NO PAST SURGERIES      No family history on file.  Social History Reviewed with no changes to be made today.   Outpatient Medications Prior to Visit  Medication Sig Dispense Refill  . clindamycin (CLINDAGEL) 1 % gel Apply topically 2 (two) times daily. (Patient not taking: Reported on 09/26/2017) 30 g 0  . cyclobenzaprine (FLEXERIL) 10 MG tablet Take 1 tablet (10 mg total) by mouth 3 (three) times daily as needed for muscle spasms. (Patient not taking: Reported on 09/26/2017) 30 tablet 0  .  naproxen (NAPROSYN) 500 MG tablet Take 1 tablet (500 mg total) by mouth 2 (two) times daily with a meal. (Patient not taking: Reported on 09/26/2017) 30 tablet 0   No facility-administered medications prior to visit.     No Known Allergies     Objective:    BP 120/72 (BP Location: Left Arm, Patient Position: Sitting, Cuff Size: Normal)   Pulse 63   Temp 98 F (36.7 C) (Oral)   Ht 5\' 6"   (1.676 m)   Wt 184 lb 6.4 oz (83.6 kg)   SpO2 93%   BMI 29.76 kg/m  Wt Readings from Last 3 Encounters:  09/28/17 184 lb 6.4 oz (83.6 kg)  09/26/17 183 lb (83 kg)  04/21/17 191 lb 3.2 oz (86.7 kg)    Physical Exam  Constitutional: He is oriented to person, place, and time. He appears well-developed and well-nourished. He is cooperative.  HENT:  Head: Normocephalic and atraumatic.  Eyes: EOM are normal.  Neck: Normal range of motion.  Cardiovascular: Normal rate, regular rhythm, normal heart sounds and intact distal pulses. Exam reveals no gallop and no friction rub.  No murmur heard. Pulmonary/Chest: Effort normal and breath sounds normal. No tachypnea. No respiratory distress. He has no decreased breath sounds. He has no wheezes. He has no rhonchi. He has no rales. He exhibits no tenderness.  Abdominal: Bowel sounds are normal.  Musculoskeletal: He exhibits no edema, tenderness or deformity.       Right shoulder: He exhibits decreased range of motion (He endorses significant pain in the right lower lumbar area while bending to touch his toes.;  There is pain elicited with active twist turn motion.) and pain.  Neurological: He is alert and oriented to person, place, and time. Coordination normal.  Skin: Skin is warm and dry.  Psychiatric: He has a normal mood and affect. His behavior is normal. Judgment and thought content normal.  Nursing note and vitals reviewed.      Patient has been counseled extensively about nutrition and exercise as well as the importance of adherence with medications and regular follow-up. The patient was given clear instructions to go to ER or return to medical center if symptoms don't improve, worsen or new problems develop. The patient verbalized understanding.   Follow-up: Return in about 1 month (around 10/26/2017) for back pain.   [name], FNP-BC [facility] [place]   09/28/2017, 2:38  PM

## 2017-10-06 ENCOUNTER — Ambulatory Visit (HOSPITAL_COMMUNITY)
Admission: RE | Admit: 2017-10-06 | Discharge: 2017-10-06 | Disposition: A | Discharge status: Home / Self Care | Payer: 59 | Source: Ambulatory Visit | Attending: Nurse Practitioner | Admitting: Nurse Practitioner

## 2017-10-06 DIAGNOSIS — G8929 Other chronic pain: Secondary | ICD-10-CM | POA: Diagnosis present

## 2017-10-06 DIAGNOSIS — M545 Low back pain, unspecified: Secondary | ICD-10-CM

## 2017-10-10 ENCOUNTER — Telehealth (INDEPENDENT_AMBULATORY_CARE_PROVIDER_SITE_OTHER): Payer: Self-pay

## 2017-10-10 NOTE — Telephone Encounter (Signed)
-----   Message from [name], NP sent at 10/09/2017  7:42 PM EDT ----- Odette Horns of spine is negative.

## 2017-10-10 NOTE — Telephone Encounter (Signed)
Patient is aware of negative Xray. [name], CMA

## 2017-10-30 ENCOUNTER — Encounter (INDEPENDENT_AMBULATORY_CARE_PROVIDER_SITE_OTHER): Payer: Self-pay

## 2017-10-30 ENCOUNTER — Ambulatory Visit (INDEPENDENT_AMBULATORY_CARE_PROVIDER_SITE_OTHER): Payer: No Typology Code available for payment source | Admitting: Physician Assistant

## 2017-12-04 IMAGING — US US ABDOMEN LIMITED
1 series · 14 of 25 positions shown · non-contrast
Comparison: None.

CLINICAL DATA: 36-year-old male with hepatitis B and abnormal
direct bilirubin.

EXAM:
ULTRASOUND ABDOMEN LIMITED RIGHT UPPER QUADRANT

[Series 1: us abdomen limited · 0.19mm/px · 14 of 38 slices shown]
[im 1/38]
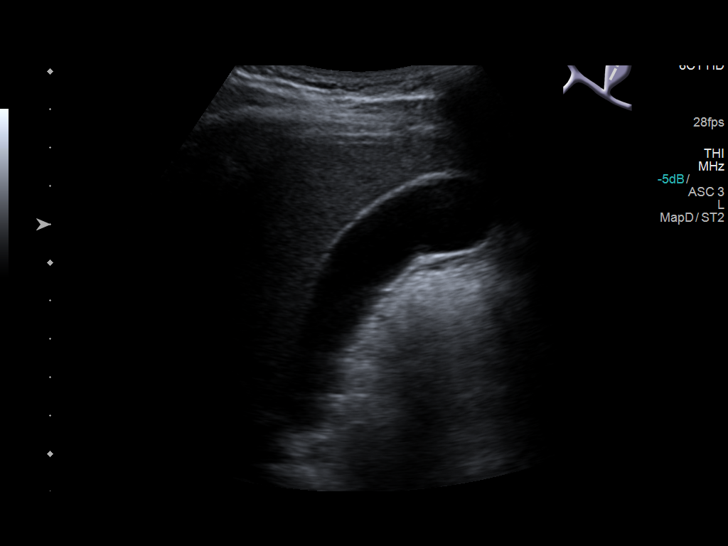
[im 4/38]
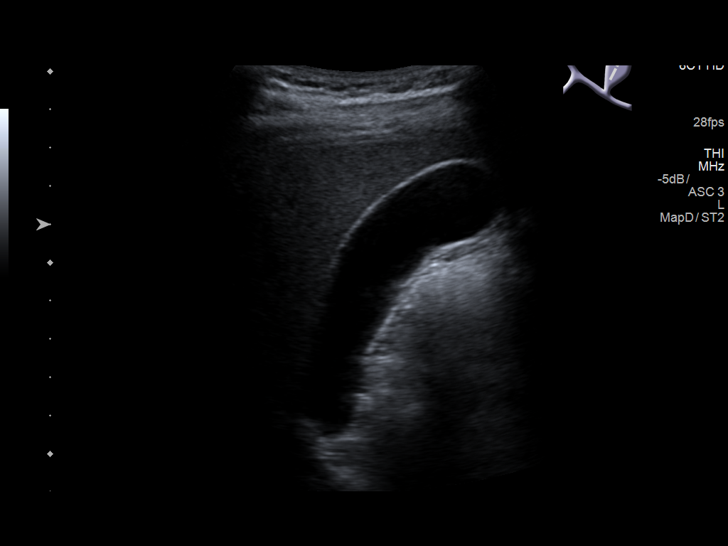
[im 7/38]
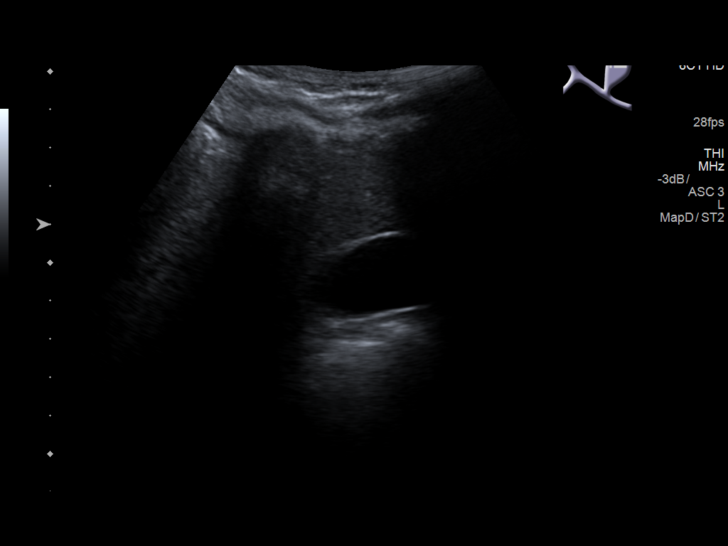
[im 10/38]
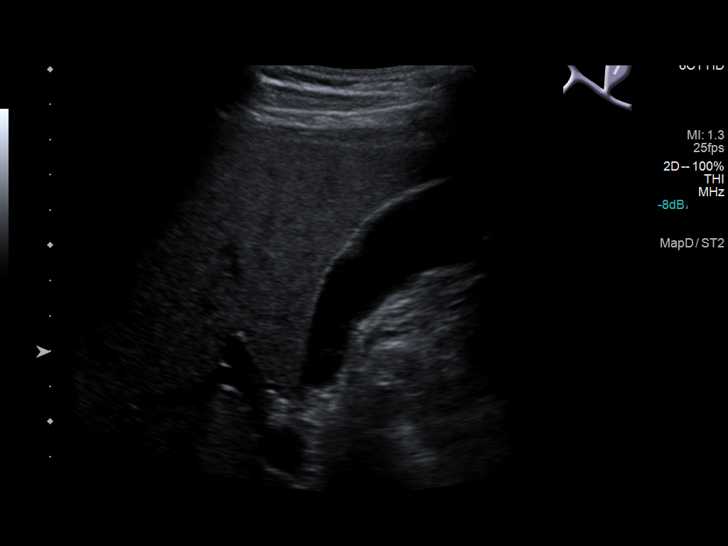
[im 13/38]
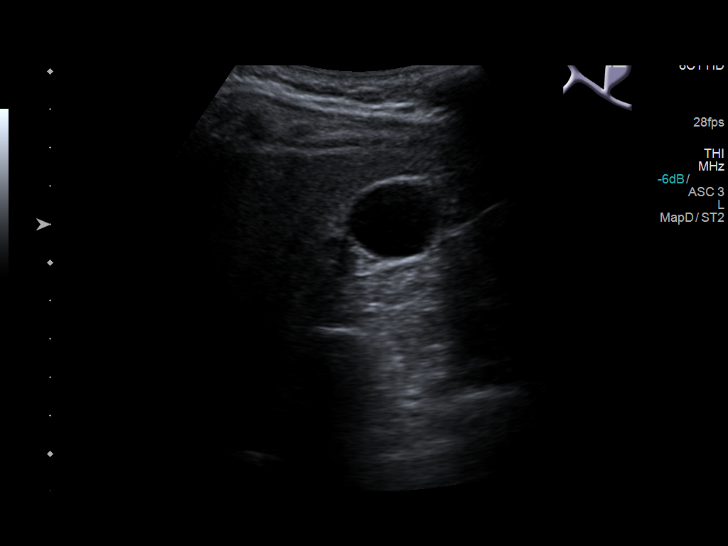
[im 14/38]
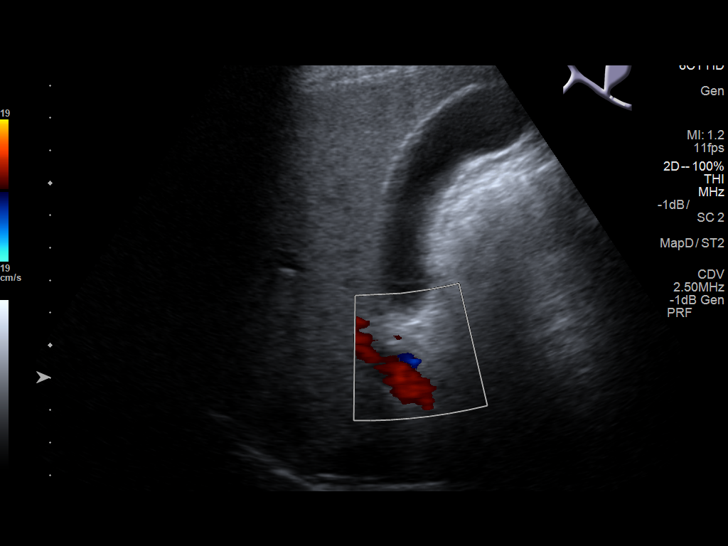
[im 17/38]
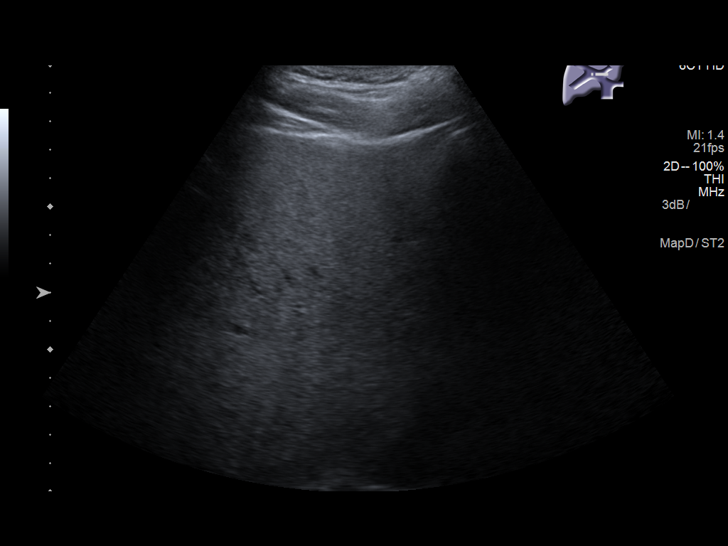
[im 21/38]
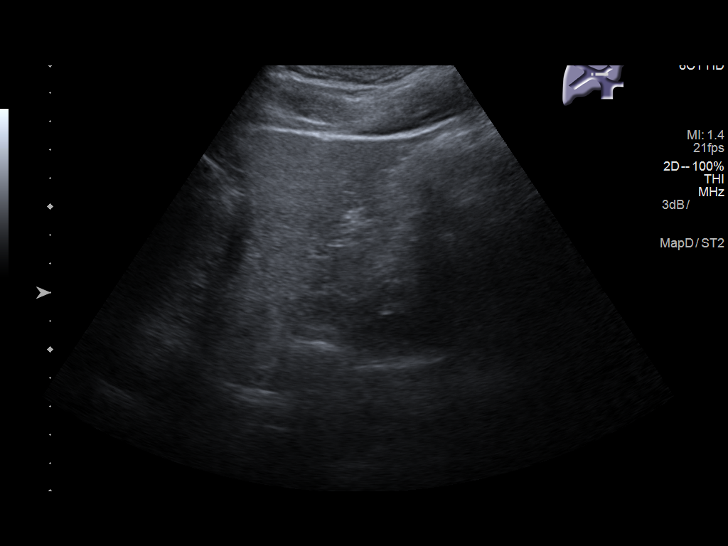
[im 24/38]
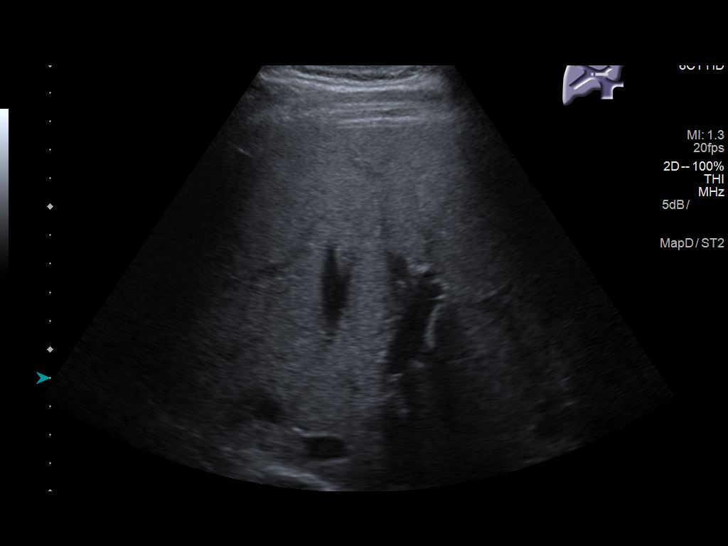
[im 25/38]
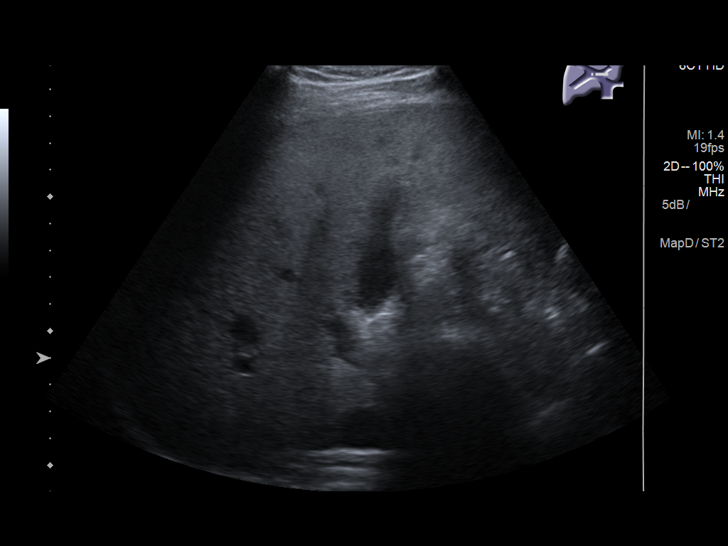
[im 28/38]
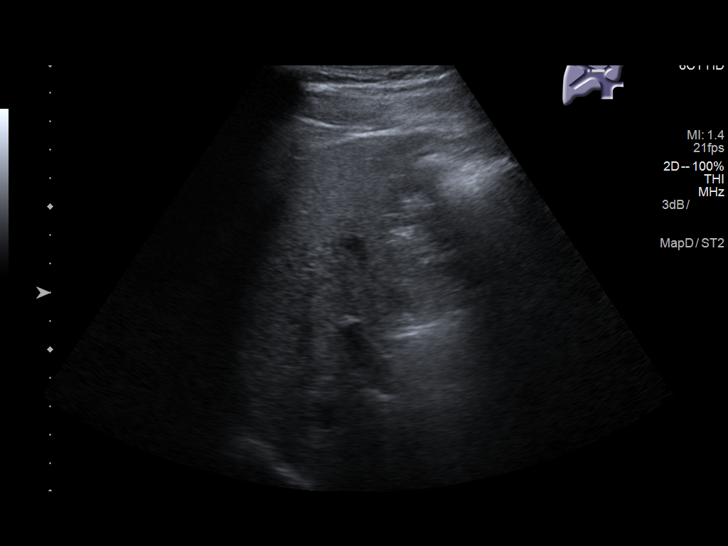
[im 31/38]
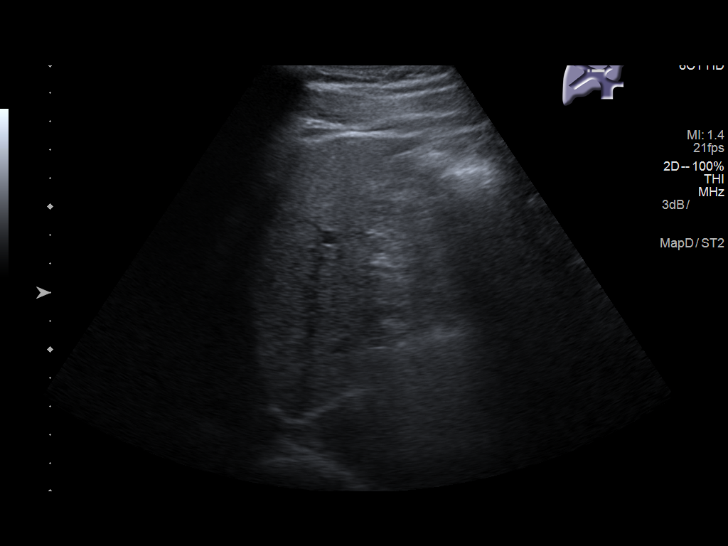
[im 34/38]
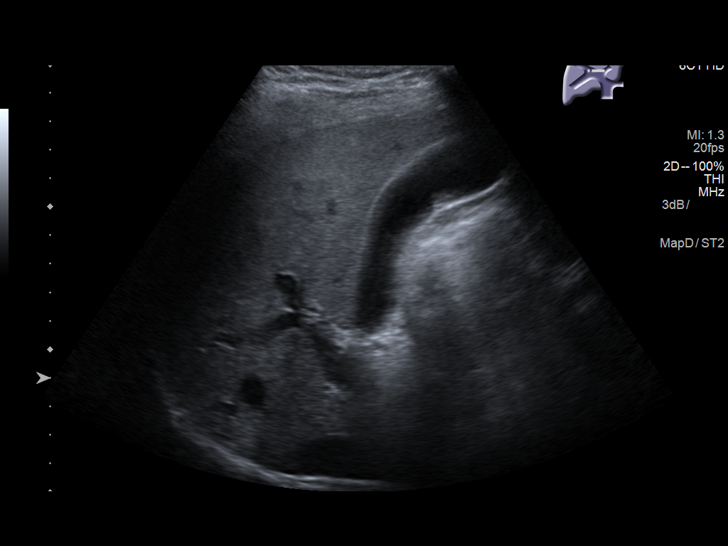
[im 38/38]
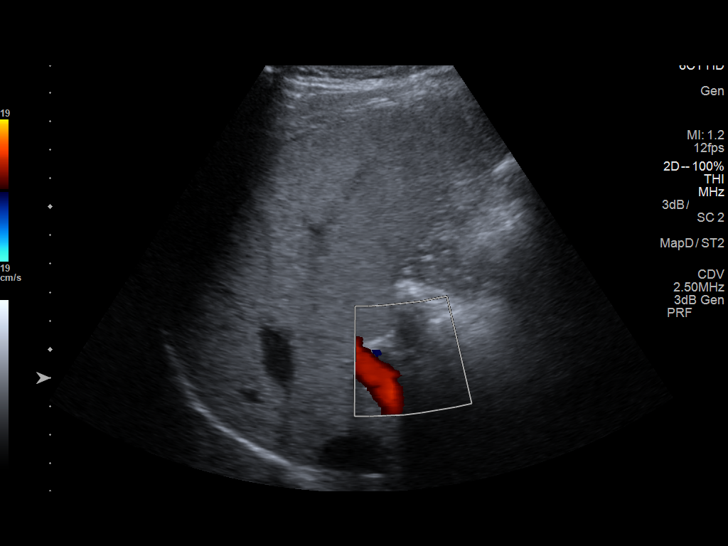

[14 of 25 positions shown; findings below may reference images not displayed]

FINDINGS: Gallbladder:

No gallstones or wall thickening visualized. No sonographic Murphy
sign noted by sonographer.

Common bile duct:

Diameter: 2.1 mm, normal.

Liver:

No focal lesion identified. Parenchymal echogenicity is diffusely
increased. Portal vein is patent on color Doppler imaging with
normal direction of blood flow towards the liver.
IMPRESSION: 1. Hepatic steatosis.
2. Otherwise unremarkable sonographic evaluation of the right upper
quadrant.

## 2018-02-28 ENCOUNTER — Encounter (HOSPITAL_COMMUNITY): Payer: Self-pay | Admitting: Emergency Medicine

## 2018-02-28 ENCOUNTER — Ambulatory Visit (HOSPITAL_COMMUNITY)
Admission: EM | Admit: 2018-02-28 | Discharge: 2018-02-28 | Disposition: A | Discharge status: Home / Self Care | Payer: BC Managed Care – PPO | Attending: Family Medicine | Admitting: Family Medicine

## 2018-02-28 DIAGNOSIS — F17200 Nicotine dependence, unspecified, uncomplicated: Secondary | ICD-10-CM | POA: Insufficient documentation

## 2018-02-28 DIAGNOSIS — Z202 Contact with and (suspected) exposure to infections with a predominantly sexual mode of transmission: Secondary | ICD-10-CM

## 2018-02-28 DIAGNOSIS — R300 Dysuria: Secondary | ICD-10-CM | POA: Diagnosis present

## 2018-02-28 DIAGNOSIS — B181 Chronic viral hepatitis B without delta-agent: Secondary | ICD-10-CM | POA: Diagnosis not present

## 2018-02-28 DIAGNOSIS — N50819 Testicular pain, unspecified: Secondary | ICD-10-CM | POA: Diagnosis not present

## 2018-02-28 DIAGNOSIS — Z113 Encounter for screening for infections with a predominantly sexual mode of transmission: Secondary | ICD-10-CM

## 2018-02-28 LAB — POCT URINALYSIS DIP (DEVICE)
Bilirubin Urine: NEGATIVE
GLUCOSE, UA: NEGATIVE mg/dL
HGB URINE DIPSTICK: NEGATIVE
KETONES UR: NEGATIVE mg/dL
Leukocytes, UA: NEGATIVE
Nitrite: NEGATIVE
PROTEIN: NEGATIVE mg/dL
SPECIFIC GRAVITY, URINE: 1.025 (ref 1.005–1.030)
UROBILINOGEN UA: 0.2 mg/dL (ref 0.0–1.0)
pH: 5.5 (ref 5.0–8.0)

## 2018-02-28 MED ORDER — AZITHROMYCIN 250 MG PO TABS
1000.0000 mg | ORAL_TABLET | Freq: Once | ORAL | Status: AC
  Administered 2018-02-28: 1000 mg via ORAL

## 2018-02-28 MED ORDER — CEFTRIAXONE SODIUM 250 MG IJ SOLR
250.0000 mg | Freq: Once | INTRAMUSCULAR | Status: AC
  Administered 2018-02-28: 250 mg via INTRAMUSCULAR

## 2018-02-28 MED ORDER — AZITHROMYCIN 250 MG PO TABS
ORAL_TABLET | ORAL | Status: AC
  Filled 2018-02-28: qty 4

## 2018-02-28 MED ORDER — CEFTRIAXONE SODIUM 250 MG IJ SOLR
INTRAMUSCULAR | Status: AC
  Filled 2018-02-28: qty 250

## 2018-02-28 NOTE — ED Notes (Signed)
Bed: UC01 Expected date:  Expected time:  Means of arrival:  Comments: Appointments 

## 2018-02-28 NOTE — ED Provider Notes (Signed)
[facility]   [id number] 02/28/18 Arrival Time: 18   CC: Burning with urination  SUBJECTIVE:  Caleb Chandler is a 37 y.o. male who presents with dysuria x 2 weeks.  Last protected sexual encounter 3 days ago.  Patient reports hx of hepatitis and last unprotected sex was "years" ago. Sexually active with 1 male partner. Complains of mild associated testicular discomfort. Denies fever, chills, nausea, vomiting, abdominal or pelvic pain, penile rashes or lesions, testicular swelling.    No LMP for male patient.  ROS: As per HPI.  Past Medical History:  Diagnosis Date  . Chronic viral hepatitis B without delta-agent ([place])   . Mass of hand, right 03/2017   Past Surgical History:  Procedure Laterality Date  . EXCISION METACARPAL MASS Right 04/03/2017   Procedure: RIGHT HAND MASS EXCISION;  Surgeon: Milly Jakob, MD;  Location: [facility];  Service: Orthopedics;  Laterality: Right;  . NO PAST SURGERIES     No Known Allergies No current facility-administered medications on file prior to encounter.    No current outpatient medications on file prior to encounter.   Social History   Socioeconomic History  . Marital status: Single    Spouse name: Not on file  . Number of children: Not on file  . Years of education: Not on file  . Highest education level: Not on file  Occupational History  . Not on file  Social Needs  . Financial resource strain: Not on file  . Food insecurity:    Worry: Not on file    Inability: Not on file  . Transportation needs:    Medical: Not on file    Non-medical: Not on file  Tobacco Use  . Smoking status: Current Some Day Smoker  . Smokeless tobacco: Never Used  . Tobacco comment: uses hookah  Substance and Sexual Activity  . Alcohol use: No  . Drug use: No  . Sexual activity: Not Currently  Lifestyle  . Physical activity:    Days per week: Not on file    Minutes per session: Not on file  . Stress: Not on  file  Relationships  . Social connections:    Talks on phone: Not on file    Gets together: Not on file    Attends religious service: Not on file    Active member of club or organization: Not on file    Attends meetings of clubs or organizations: Not on file    Relationship status: Not on file  . Intimate partner violence:    Fear of current or ex partner: Not on file    Emotionally abused: Not on file    Physically abused: Not on file    Forced sexual activity: Not on file  Other Topics Concern  . Not on file  Social History Narrative  . Not on file   No family history on file.  OBJECTIVE:  Vitals:   02/28/18 1632  BP: 130/80  Pulse: 82  Resp: 16  Temp: 98.6 F (37 C)  SpO2: 97%     General appearance: alert, cooperative, appears stated age and no distress Throat: lips, mucosa, and tongue normal; teeth and gums normal Lungs: CTA bilaterally without adventitious breath sounds Heart: regular rate and rhythm.  Radial pulses 2+ symmetrical bilaterally Abdomen: soft, non-tender; bowel sounds normal; no masses or organomegaly; no guarding or rebound tenderness GU: Declines chaperone.  Circumcised male; no inguinal LAD; no obvious rashes or lesions; no penile discharge; no testicular masses  or nodules appreciated, testicles nontender to palpation; no obvious swelling; testicles appear symmetrical in size Skin: warm and dry Psychological:  Alert and cooperative. Normal mood and affect.  LABS:  Results for orders placed or performed during the hospital encounter of 02/28/18 (from the past 24 hour(s))  POCT urinalysis dip (device)     Status: None   Collection Time: 02/28/18  4:50 PM  Result Value Ref Range   Glucose, UA NEGATIVE NEGATIVE mg/dL   Bilirubin Urine NEGATIVE NEGATIVE   Ketones, ur NEGATIVE NEGATIVE mg/dL   Specific Gravity, Urine 1.025 1.005 - 1.030   Hgb urine dipstick NEGATIVE NEGATIVE   pH 5.5 5.0 - 8.0   Protein, ur NEGATIVE NEGATIVE mg/dL    Urobilinogen, UA 0.2 0.0 - 1.0 mg/dL   Nitrite NEGATIVE NEGATIVE   Leukocytes, UA NEGATIVE NEGATIVE   ASSESSMENT & PLAN:  1. Dysuria     Meds ordered this encounter  Medications  . azithromycin (ZITHROMAX) tablet 1,000 mg  . cefTRIAXone (ROCEPHIN) injection 250 mg    Pending: Labs Reviewed  POCT URINALYSIS DIP (DEVICE)  URINE CYTOLOGY ANCILLARY ONLY    Given rocephin 250mg  injection and azithromycin 1g in office Urine cytology obtained We will follow up with you regarding the results of your test If tests are positive, please abstain from sexual activity for at least 7 days and notify partners Follow up with urologist if symptoms persists Return here or go to ER if you have any new or worsening symptoms as testicular pain or swelling, fever, chills, nausea, vomiting, penile discharge, etc...  Reviewed expectations re: course of current medical issues. Questions answered. Outlined signs and symptoms indicating need for more acute intervention. Patient verbalized understanding. After Visit Summary given.       Lestine Box, PA-C 02/28/18 1922

## 2018-02-28 NOTE — Discharge Instructions (Addendum)
Given rocephin 250mg  injection and azithromycin 1g in office Urine cytology obtained We will follow up with you regarding the results of your test If tests are positive, please abstain from sexual activity for at least 7 days and notify partners Follow up with urologist if symptoms persists Return here or go to ER if you have any new or worsening symptoms as testicular pain or swelling, fever, chills, nausea, vomiting, penile discharge, etc..Marland Kitchen

## 2018-02-28 NOTE — ED Triage Notes (Signed)
Pt c/o burning with urination x2 weeks, also c/o penile burning and lower pelvic pain.

## 2018-03-01 LAB — URINE CYTOLOGY ANCILLARY ONLY
Chlamydia: NEGATIVE
NEISSERIA GONORRHEA: NEGATIVE
Trichomonas: NEGATIVE

## 2018-03-20 ENCOUNTER — Other Ambulatory Visit: Payer: BC Managed Care – PPO

## 2018-03-20 DIAGNOSIS — B181 Chronic viral hepatitis B without delta-agent: Secondary | ICD-10-CM

## 2018-03-21 LAB — COMPREHENSIVE METABOLIC PANEL
AG Ratio: 1.4 (calc) (ref 1.0–2.5)
ALT: 21 U/L (ref 9–46)
AST: 22 U/L (ref 10–40)
Albumin: 4.4 g/dL (ref 3.6–5.1)
Alkaline phosphatase (APISO): 41 U/L (ref 40–115)
BILIRUBIN TOTAL: 0.6 mg/dL (ref 0.2–1.2)
BUN: 17 mg/dL (ref 7–25)
CALCIUM: 9.4 mg/dL (ref 8.6–10.3)
CO2: 26 mmol/L (ref 20–32)
Chloride: 104 mmol/L (ref 98–110)
Creat: 0.91 mg/dL (ref 0.60–1.35)
Globulin: 3.1 g/dL (calc) (ref 1.9–3.7)
Glucose, Bld: 117 mg/dL — ABNORMAL HIGH (ref 65–99)
Potassium: 3.9 mmol/L (ref 3.5–5.3)
SODIUM: 138 mmol/L (ref 135–146)
TOTAL PROTEIN: 7.5 g/dL (ref 6.1–8.1)

## 2018-03-21 LAB — HEPATITIS B DNA, ULTRAQUANTITATIVE, PCR
Hepatitis B DNA (Calc): 2.03 Log IU/mL — ABNORMAL HIGH
Hepatitis B DNA: 106 IU/mL — ABNORMAL HIGH

## 2018-03-21 LAB — HEPATITIS B E ANTIGEN: Hep B E Ag: NONREACTIVE

## 2018-03-21 LAB — HEPATITIS B E ANTIBODY: HEP B E AB: REACTIVE — AB

## 2018-04-03 ENCOUNTER — Encounter: Payer: Self-pay | Admitting: Internal Medicine

## 2018-04-03 ENCOUNTER — Ambulatory Visit (INDEPENDENT_AMBULATORY_CARE_PROVIDER_SITE_OTHER): Payer: Self-pay | Admitting: Internal Medicine

## 2018-04-03 DIAGNOSIS — B181 Chronic viral hepatitis B without delta-agent: Secondary | ICD-10-CM

## 2018-04-03 NOTE — Assessment & Plan Note (Signed)
His blood work continues to look very good.  There is no current indication to start antiviral therapy.  He will follow-up after lab work in 12 months.

## 2018-04-03 NOTE — Progress Notes (Signed)
         [facility] for Infectious Disease  Patient Active Problem List   Diagnosis Date Noted  . Chronic viral hepatitis B without delta-agent ([place]) 02/21/2017    Patient's Medications   No medications on file    Subjective: Caleb Chandler is in for his routine follow-up visit.  He is feeling well. He is on no medications.  He is hoping that his fiance can come to the [place] and they can be married next year.  He has asked her to get vaccinated against hepatitis B.  They have never been sexually active.  Review of Systems: Review of Systems  Constitutional: Negative for chills, diaphoresis, fever and weight loss.  Gastrointestinal: Negative for abdominal pain, diarrhea, nausea and vomiting.  Skin: Negative for rash.    Past Medical History:  Diagnosis Date  . Chronic viral hepatitis B without delta-agent ([place])   . Mass of hand, right 03/2017    Social History   Tobacco Use  . Smoking status: Former Research scientist (life sciences)  . Smokeless tobacco: Current User  . Tobacco comment: uses hookah  Substance Use Topics  . Alcohol use: No  . Drug use: No    No family history on file.  No Known Allergies  Objective: Vitals:   04/03/18 0921  BP: 124/83  Pulse: 93  Temp: 98.9 F (37.2 C)  Weight: 202 lb (91.6 kg)   Body mass index is 32.6 kg/m.  Physical Exam  Constitutional: He is oriented to person, place, and time.  He is in good spirits.  Abdominal: Soft. He exhibits no distension and no mass. There is no tenderness.  Neurological: He is alert and oriented to person, place, and time.  Skin: No rash noted.  Psychiatric: He has a normal mood and affect.    Lab Results 03/21/2018 Liver enzymes normal Hepatitis B e antigen negative It is B DNA viral load 106 IU/ml    Problem List Items Addressed This Visit      Unprioritized   Chronic viral hepatitis B without delta-agent ([place])    His blood work continues to look very good.  There is no current indication to start  antiviral therapy.  He will follow-up after lab work in 12 months.      Relevant Orders   Hepatitis B DNA, ultraquantitative, PCR   Hepatitis B e antibody   Hepatitis B e antigen   Comprehensive metabolic panel       Michel Bickers, MD [facility] for [place] [phone] pager   [phone] cell 04/03/2018, 9:37 AM

## 2018-06-06 ENCOUNTER — Ambulatory Visit (INDEPENDENT_AMBULATORY_CARE_PROVIDER_SITE_OTHER): Payer: BC Managed Care – PPO | Admitting: Nurse Practitioner

## 2018-07-23 ENCOUNTER — Ambulatory Visit (INDEPENDENT_AMBULATORY_CARE_PROVIDER_SITE_OTHER): Payer: BC Managed Care – PPO | Admitting: Primary Care

## 2018-07-23 ENCOUNTER — Encounter (INDEPENDENT_AMBULATORY_CARE_PROVIDER_SITE_OTHER): Payer: Self-pay | Admitting: Primary Care

## 2018-07-23 ENCOUNTER — Other Ambulatory Visit: Payer: Self-pay

## 2018-07-23 VITALS — BP 119/81 | HR 82 | Temp 97.7°F | Ht 66.0 in | Wt 202.0 lb

## 2018-07-23 DIAGNOSIS — R42 Dizziness and giddiness: Secondary | ICD-10-CM | POA: Diagnosis not present

## 2018-07-23 DIAGNOSIS — Z23 Encounter for immunization: Secondary | ICD-10-CM

## 2018-07-23 DIAGNOSIS — R739 Hyperglycemia, unspecified: Secondary | ICD-10-CM

## 2018-07-23 DIAGNOSIS — D696 Thrombocytopenia, unspecified: Secondary | ICD-10-CM | POA: Diagnosis not present

## 2018-07-23 DIAGNOSIS — B181 Chronic viral hepatitis B without delta-agent: Secondary | ICD-10-CM | POA: Diagnosis not present

## 2018-07-23 DIAGNOSIS — R5383 Other fatigue: Secondary | ICD-10-CM | POA: Diagnosis not present

## 2018-07-23 LAB — POCT GLYCOSYLATED HEMOGLOBIN (HGB A1C): Hemoglobin A1C: 5.3 % (ref 4.0–5.6)

## 2018-07-23 NOTE — Progress Notes (Signed)
Acute Office Visit  Subjective:    Patient ID: Caleb Chandler, male    DOB: 1981-05-14, 38 y.o.   MRN: [id number]  Chief Complaint  Patient presents with  . Back Pain  . Dizziness    2 months.. when stretching     HPI Patient is in today for an acute visit weak and dizziness when strengthing. Ortho static bp were obtain no significant difference , checked A1C POCT 5.3 . Will ck routine labs for other indicators .PMH of  dysuria and  Hepatitis B  Past Medical History:  Diagnosis Date  . Chronic viral hepatitis B without delta-agent ([place])   . Mass of hand, right 03/2017    Past Surgical History:  Procedure Laterality Date  . EXCISION METACARPAL MASS Right 04/03/2017   Procedure: RIGHT HAND MASS EXCISION;  Surgeon: Milly Jakob, MD;  Location: [facility];  Service: Orthopedics;  Laterality: Right;  . NO PAST SURGERIES      History reviewed. No pertinent family history.  Social History   Socioeconomic History  . Marital status: Single    Spouse name: Not on file  . Number of children: Not on file  . Years of education: Not on file  . Highest education level: Not on file  Occupational History  . Not on file  Social Needs  . Financial resource strain: Not on file  . Food insecurity:    Worry: Not on file    Inability: Not on file  . Transportation needs:    Medical: Not on file    Non-medical: Not on file  Tobacco Use  . Smoking status: Former Research scientist (life sciences)  . Smokeless tobacco: Current User  . Tobacco comment: uses hookah  Substance and Sexual Activity  . Alcohol use: No  . Drug use: No  . Sexual activity: Not Currently  Lifestyle  . Physical activity:    Days per week: Not on file    Minutes per session: Not on file  . Stress: Not on file  Relationships  . Social connections:    Talks on phone: Not on file    Gets together: Not on file    Attends religious service: Not on file    Active member of club or organization: Not on file    Attends  meetings of clubs or organizations: Not on file    Relationship status: Not on file  . Intimate partner violence:    Fear of current or ex partner: Not on file    Emotionally abused: Not on file    Physically abused: Not on file    Forced sexual activity: Not on file  Other Topics Concern  . Not on file  Social History Narrative  . Not on file    No outpatient medications prior to visit.   No facility-administered medications prior to visit.     No Known Allergies  Review of Systems  Constitutional: Positive for malaise/fatigue.  HENT: Negative.   Eyes: Negative.   Respiratory: Negative.   Cardiovascular: Negative.   Gastrointestinal: Negative.   Genitourinary: Negative.   Skin: Negative.   Neurological: Positive for weakness and headaches.  Endo/Heme/Allergies: Negative.        Objective:    Physical Exam  Constitutional: He is oriented to person, place, and time. He appears well-developed and well-nourished.  HENT:  Head: Normocephalic.  Neck: Normal range of motion.  Cardiovascular: Normal rate and regular rhythm.  Pulmonary/Chest: Effort normal and breath sounds normal.  Abdominal: Soft. Bowel sounds  are normal. He exhibits distension.  Musculoskeletal: Normal range of motion.  Neurological: He is alert and oriented to person, place, and time.  Skin: Skin is warm and dry.  Psychiatric: He has a normal mood and affect.    BP 119/81 (BP Location: Left Arm, Patient Position: Sitting, Cuff Size: Normal)   Pulse 82   Temp 97.7 F (36.5 C) (Oral)   Ht 5\' 6"  (1.676 m)   Wt 202 lb (91.6 kg)   SpO2 95%   BMI 32.60 kg/m  Wt Readings from Last 3 Encounters:  07/23/18 202 lb (91.6 kg)  04/03/18 202 lb (91.6 kg)  09/28/17 184 lb 6.4 oz (83.6 kg)    Health Maintenance Due  Topic Date Due  . INFLUENZA VACCINE  12/14/2017    There are no preventive care reminders to display for this patient.   No results found for: TSH Lab Results  Component Value Date    WBC 5.0 04/21/2017   HGB 15.8 04/21/2017   HCT 44.0 04/21/2017   MCV 87 04/21/2017   PLT 142 (L) 04/21/2017   Lab Results  Component Value Date   NA 138 03/20/2018   K 3.9 03/20/2018   CO2 26 03/20/2018   GLUCOSE 117 (H) 03/20/2018   BUN 17 03/20/2018   CREATININE 0.91 03/20/2018   BILITOT 0.6 03/20/2018   ALKPHOS 43 03/29/2017   AST 22 03/20/2018   ALT 21 03/20/2018   PROT 7.5 03/20/2018   ALBUMIN 4.1 03/29/2017   CALCIUM 9.4 03/20/2018   ANIONGAP 8 03/29/2017   No results found for: CHOL No results found for: HDL No results found for: LDLCALC No results found for: TRIG No results found for: [facility] Lab Results  Component Value Date   HGBA1C 5.3 07/23/2018       Assessment & Plan:  1. Thrombocytopenia (HCC)  - CBC with Differential - Comprehensive metabolic panel  2. Chronic viral hepatitis B without delta agent and without coma (HCC) Followed by Dr.Campbell  - Comprehensive metabolic panel  3. Fatigue, unspecified type R/o other factors anemia /thyroid dsyfunction - CBC with Differential - Thyroid Panel With TSH  4. Dizzy spells Refer to neurology and ck blood work - CBC with Differential  Problem List Items Addressed This Visit    Chronic viral hepatitis B without delta-agent ([place])   Relevant Orders   Comprehensive metabolic panel   Thrombocytopenia ([place])   Relevant Orders   CBC with Differential   Comprehensive metabolic panel    Other Visit Diagnoses    Fatigue, unspecified type    -  Primary   Relevant Orders   CBC with Differential   Thyroid Panel With TSH   Ambulatory referral to Neurology   Dizzy spells       Relevant Orders   CBC with Differential   Hyperglycemia       Relevant Orders   HgB A1c (Completed)   Need for immunization against influenza       Relevant Orders   Flu Vaccine QUAD 36+ mos IM (Completed)       No orders of the defined types were placed in this encounter.    Kerin Perna, NP

## 2018-07-23 NOTE — Patient Instructions (Signed)

## 2018-07-23 NOTE — Progress Notes (Signed)
Pt complains of always feeling hot, even is breath

## 2018-07-24 LAB — CBC WITH DIFFERENTIAL/PLATELET
BASOS: 1 %
Basophils Absolute: 0.1 10*3/uL (ref 0.0–0.2)
EOS (ABSOLUTE): 0.1 10*3/uL (ref 0.0–0.4)
Eos: 3 %
HEMOGLOBIN: 16.4 g/dL (ref 13.0–17.7)
Hematocrit: 45.1 % (ref 37.5–51.0)
IMMATURE GRANS (ABS): 0 10*3/uL (ref 0.0–0.1)
Immature Granulocytes: 0 %
LYMPHS ABS: 2.8 10*3/uL (ref 0.7–3.1)
LYMPHS: 52 %
MCH: 31.7 pg (ref 26.6–33.0)
MCHC: 36.4 g/dL — ABNORMAL HIGH (ref 31.5–35.7)
MCV: 87 fL (ref 79–97)
MONOCYTES: 12 %
Monocytes Absolute: 0.6 10*3/uL (ref 0.1–0.9)
NEUTROS ABS: 1.7 10*3/uL (ref 1.4–7.0)
Neutrophils: 32 %
Platelets: 156 10*3/uL (ref 150–450)
RBC: 5.18 x10E6/uL (ref 4.14–5.80)
RDW: 12.9 % (ref 11.6–15.4)
WBC: 5.3 10*3/uL (ref 3.4–10.8)

## 2018-07-24 LAB — COMPREHENSIVE METABOLIC PANEL
A/G RATIO: 1.5 (ref 1.2–2.2)
ALBUMIN: 4.7 g/dL (ref 4.0–5.0)
ALK PHOS: 56 IU/L (ref 39–117)
ALT: 25 IU/L (ref 0–44)
AST: 25 IU/L (ref 0–40)
BILIRUBIN TOTAL: 0.4 mg/dL (ref 0.0–1.2)
BUN / CREAT RATIO: 14 (ref 9–20)
BUN: 14 mg/dL (ref 6–20)
CHLORIDE: 102 mmol/L (ref 96–106)
CO2: 25 mmol/L (ref 20–29)
Calcium: 9.7 mg/dL (ref 8.7–10.2)
Creatinine, Ser: 1 mg/dL (ref 0.76–1.27)
GFR calc non Af Amer: 96 mL/min/{1.73_m2} (ref >59)
GFR, EST AFRICAN AMERICAN: 111 mL/min/{1.73_m2} (ref >59)
Globulin, Total: 3.2 g/dL (ref 1.5–4.5)
Glucose: 83 mg/dL (ref 65–99)
POTASSIUM: 4 mmol/L (ref 3.5–5.2)
SODIUM: 140 mmol/L (ref 134–144)
TOTAL PROTEIN: 7.9 g/dL (ref 6.0–8.5)

## 2018-07-24 LAB — THYROID PANEL WITH TSH
FREE THYROXINE INDEX: 4.3 (ref 1.2–4.9)
T3 Uptake Ratio: 33 % (ref 24–39)
T4, Total: 13 ug/dL — ABNORMAL HIGH (ref 4.5–12.0)
TSH: 1.61 u[IU]/mL (ref 0.450–4.500)

## 2018-07-25 ENCOUNTER — Other Ambulatory Visit (INDEPENDENT_AMBULATORY_CARE_PROVIDER_SITE_OTHER): Payer: Self-pay | Admitting: Primary Care

## 2018-07-30 ENCOUNTER — Other Ambulatory Visit (INDEPENDENT_AMBULATORY_CARE_PROVIDER_SITE_OTHER): Payer: BC Managed Care – PPO

## 2018-07-30 ENCOUNTER — Other Ambulatory Visit (INDEPENDENT_AMBULATORY_CARE_PROVIDER_SITE_OTHER): Payer: Self-pay | Admitting: Primary Care

## 2018-07-30 ENCOUNTER — Other Ambulatory Visit: Payer: Self-pay

## 2018-07-30 ENCOUNTER — Telehealth (INDEPENDENT_AMBULATORY_CARE_PROVIDER_SITE_OTHER): Payer: Self-pay

## 2018-07-30 DIAGNOSIS — E038 Other specified hypothyroidism: Secondary | ICD-10-CM

## 2018-07-30 NOTE — Telephone Encounter (Signed)
Patient is aware that all labs are normal other than thyroid. He is aware that additional testing is needed for thyroid. Patient will come in today for further testing. Please place order. [name], CMA

## 2018-07-30 NOTE — Progress Notes (Unsigned)
Elevated T4 ordered TPO

## 2018-07-30 NOTE — Telephone Encounter (Signed)
-----   Message from Kerin Perna, NP sent at 07/25/2018  9:08 AM EDT ----- Not on meds for thyroids . Pt needs to come in for a confirmation for dx  For thyroid-TPO

## 2018-07-31 LAB — THYROID PEROXIDASE ANTIBODY: THYROID PEROXIDASE ANTIBODY: 28 [IU]/mL (ref 0–34)

## 2018-08-01 ENCOUNTER — Telehealth (INDEPENDENT_AMBULATORY_CARE_PROVIDER_SITE_OTHER): Payer: Self-pay

## 2018-08-01 NOTE — Telephone Encounter (Signed)
-----   Message from Kerin Perna, NP sent at 07/31/2018  9:19 AM EDT ----- Test normal no thyroid problems at this time

## 2018-08-01 NOTE — Telephone Encounter (Signed)
Patient is aware that additional thyroid testing is normal. No thyroid problem at this time. [name], CMA

## 2018-11-15 ENCOUNTER — Ambulatory Visit (INDEPENDENT_AMBULATORY_CARE_PROVIDER_SITE_OTHER): Payer: BC Managed Care – PPO | Admitting: Primary Care

## 2018-11-15 ENCOUNTER — Other Ambulatory Visit: Payer: Self-pay

## 2018-11-15 ENCOUNTER — Ambulatory Visit (HOSPITAL_COMMUNITY)
Admission: EM | Admit: 2018-11-15 | Discharge: 2018-11-15 | Disposition: A | Discharge status: Home / Self Care | Payer: BC Managed Care – PPO | Attending: Internal Medicine | Admitting: Internal Medicine

## 2018-11-15 ENCOUNTER — Encounter (HOSPITAL_COMMUNITY): Payer: Self-pay | Admitting: *Deleted

## 2018-11-15 DIAGNOSIS — R0989 Other specified symptoms and signs involving the circulatory and respiratory systems: Secondary | ICD-10-CM

## 2018-11-15 DIAGNOSIS — R300 Dysuria: Secondary | ICD-10-CM

## 2018-11-15 DIAGNOSIS — R6889 Other general symptoms and signs: Secondary | ICD-10-CM | POA: Diagnosis present

## 2018-11-15 LAB — POCT URINALYSIS DIP (DEVICE)
Bilirubin Urine: NEGATIVE
Glucose, UA: NEGATIVE mg/dL
Hgb urine dipstick: NEGATIVE
Leukocytes,Ua: NEGATIVE
Nitrite: NEGATIVE
Protein, ur: NEGATIVE mg/dL
Specific Gravity, Urine: >=1.03 (ref 1.005–1.030)
Urobilinogen, UA: 0.2 mg/dL (ref 0.0–1.0)
pH: 5.5 (ref 5.0–8.0)

## 2018-11-15 MED ORDER — PANTOPRAZOLE SODIUM 20 MG PO TBEC: 20.0000 mg | DELAYED_RELEASE_TABLET | Freq: Every day | ORAL | 1 refills | Status: DC

## 2018-11-15 NOTE — ED Triage Notes (Signed)
C/O dysuria and low abd pains since 6/25.  Denies any fevers or penile discharge.  Also c/o ongoing sensation of foreign body in throat "for a long time".

## 2018-11-15 NOTE — ED Provider Notes (Signed)
[facility]    CSN: [id number] Arrival date & time: 11/15/18  1621     History   Chief Complaint Chief Complaint  Patient presents with  . Appointment    1630  . Dysuria  . Abdominal Pain    HPI Caleb Chandler is a 38 y.o. male with a history of chronic hepatitis B infection comes to urgent care with complaints of lower abdominal pain and dysuria since the 25th.  Symptoms started insidiously and is gotten progressively worse.  He has not tried any medications.  He denies any fever or chills.  No flank pain.  No penile discharge.  He has left testicular pain/tenderness.  Testicular pain is worse at night and improves in the morning.  No trauma to the testis.  No problems with bowel movements.  No nausea or vomiting.  Patient also complains of a sensation of foreign body in the throat and he has to clear his throat most of the time.  He denies any postnasal drip.  No nausea or vomiting.  He has epigastric abdominal pain which is worsened with oral intake.  HPI  Past Medical History:  Diagnosis Date  . Chronic viral hepatitis B without delta-agent ([place])   . Mass of hand, right 03/2017    Patient Active Problem List   Diagnosis Date Noted  . Thrombocytopenia ([place]) 07/23/2018  . Chronic viral hepatitis B without delta-agent ([place]) 02/21/2017    Past Surgical History:  Procedure Laterality Date  . EXCISION METACARPAL MASS Right 04/03/2017   Procedure: RIGHT HAND MASS EXCISION;  Surgeon: Milly Jakob, MD;  Location: [facility];  Service: Orthopedics;  Laterality: Right;       Home Medications    Prior to Admission medications   Medication Sig Start Date End Date Taking? Authorizing Provider  pantoprazole (PROTONIX) 20 MG tablet Take 1 tablet (20 mg total) by mouth daily. 11/15/18   Jacaden Forbush, Myrene Galas, MD    Family History Family History  Family history unknown: Yes    Social History Social History   Tobacco Use  . Smoking status: Former  Research scientist (life sciences)  . Smokeless tobacco: Never Used  . Tobacco comment: uses hookah rarely  Substance Use Topics  . Alcohol use: No  . Drug use: No     Allergies   Patient has no known allergies.   Review of Systems Review of Systems  Constitutional: Negative.   HENT: Negative for rhinorrhea, sinus pressure, sinus pain, sneezing, sore throat and voice change.   Eyes: Negative.   Respiratory: Negative.   Gastrointestinal: Positive for abdominal pain. Negative for constipation, diarrhea, nausea and rectal pain.  Genitourinary: Positive for testicular pain. Negative for discharge, dysuria, flank pain, genital sores, penile pain, scrotal swelling and urgency.  Musculoskeletal: Negative.   Skin: Negative.   Neurological: Negative for dizziness, syncope, weakness, numbness and headaches.     Physical Exam Triage Vital Signs ED Triage Vitals  Enc Vitals Group     BP 11/15/18 1632 130/89     Pulse Rate 11/15/18 1632 63     Resp --      Temp 11/15/18 1632 98.5 F (36.9 C)     Temp Source 11/15/18 1632 Oral     SpO2 11/15/18 1632 97 %     Weight --      Height --      Head Circumference --      Peak Flow --      Pain Score 11/15/18 1633 6  Pain Loc --      Pain Edu? --      Excl. in [place]? --    No data found.  Updated Vital Signs BP 130/89   Pulse 63   Temp 98.5 F (36.9 C) (Oral)   SpO2 97%   Visual Acuity Right Eye Distance:   Left Eye Distance:   Bilateral Distance:    Right Eye Near:   Left Eye Near:    Bilateral Near:     Physical Exam Constitutional:      Appearance: He is well-developed. He is not ill-appearing or toxic-appearing.  HENT:     Mouth/Throat:     Pharynx: No pharyngeal swelling or oropharyngeal exudate.  Cardiovascular:     Rate and Rhythm: Normal rate and regular rhythm.     Heart sounds: No murmur. No friction rub.  Pulmonary:     Effort: Pulmonary effort is normal.     Breath sounds: Normal breath sounds.  Abdominal:     General: Abdomen  is protuberant. There is no distension or abdominal bruit. There are no signs of injury.     Tenderness: There is no abdominal tenderness.     Hernia: No hernia is present.  Genitourinary:    Scrotum/Testes: Normal.     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  No masses palpable.   Skin:    Capillary Refill: Capillary refill takes less than 2 seconds.  Neurological:     General: No focal deficit present.     Mental Status: He is alert.      UC Treatments / Results  Labs (all labs ordered are listed, but only abnormal results are displayed) Labs Reviewed  POCT URINALYSIS DIP (DEVICE) - Abnormal; Notable for the following components:      Result Value   Ketones, ur TRACE (*)    All other components within normal limits  RPR  HIV ANTIBODY (ROUTINE TESTING W REFLEX)  URINE CYTOLOGY ANCILLARY ONLY    EKG   Radiology No results found.  Procedures Procedures (including critical care time)  Medications Ordered in UC Medications - No data to display  Initial Impression / Assessment and Plan / UC Course  I have reviewed the triage vital signs and the nursing notes.  Pertinent labs & imaging results that were available during my care of the patient were reviewed by me and considered in my medical decision making (see chart for details).    1.  Chronic throat clearing, ?Gastroesophageal reflux disease: Protonix 20 mg orally daily  2.  Dysuria: Point-of-care urinalysis GC chlamydia/trichomonas  3.  Testicular pain: No masses palpated. Patient may have some epididymitis Urine evaluation as stated above Patient advised to do regular testicular exam Final Clinical Impressions(s) / UC Diagnoses   Final diagnoses:  Dysuria  Chronic throat clearing   Discharge Instructions   None    ED Prescriptions    Medication Sig Dispense Auth. Provider   pantoprazole (PROTONIX) 20 MG tablet Take 1 tablet (20 mg total) by mouth daily. 30 tablet Jozalyn Baglio, Myrene Galas, MD      Controlled Substance Prescriptions [place] Controlled Substance Registry consulted? No   Chase Picket, MD 11/15/18 [phone]

## 2018-11-16 LAB — RPR: RPR Ser Ql: NONREACTIVE

## 2018-11-17 LAB — HIV ANTIBODY (ROUTINE TESTING W REFLEX): HIV Screen 4th Generation wRfx: NONREACTIVE

## 2018-11-20 LAB — URINE CYTOLOGY ANCILLARY ONLY
Chlamydia: NEGATIVE
Neisseria Gonorrhea: NEGATIVE
Trichomonas: NEGATIVE

## 2018-12-15 IMAGING — MR MR [PERSON_NAME]*[PERSON_NAME]* WO/W CM
12 series · 40 of 40 positions shown · IV contrast (multihance)
Comparison: Radiographs 02/27/2017.

CLINICAL DATA: 36-year-old with mass in the palm of the right hand
for 17 years. Partially calcified on radiographs. No history of
malignancy or prior relevant surgery.

EXAM:
MRI OF THE RIGHT HAND WITHOUT AND WITH CONTRAST
TECHNIQUE: Multiplanar, multisequence MR imaging of the right hand was
performed before and after the administration of intravenous
contrast.
CONTRAST:  17mL MULTIHANCE GADOBENATE DIMEGLUMINE 529 MG/ML IV SOLN

[Series 4: T2 fat-sat · axial · right · 3.0mm · 0.38mm/px · z∈[-85,+73]mm · 4 of 50 slices shown (1 of 3)]
[im 1/50]
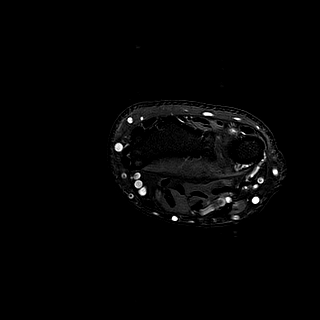
[im 17/50]
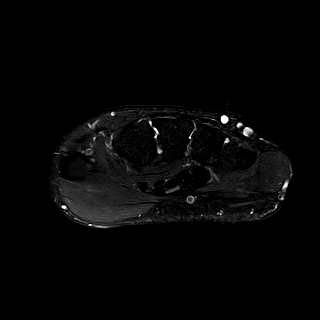
[im 33/50]
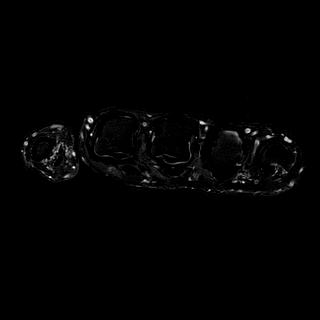
[im 50/50]
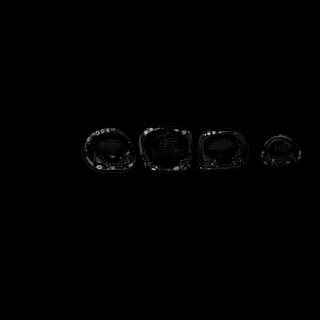

[Series 5: T1 · coronal · right · 3.0mm · 0.44mm/px · 2 of 15 slices shown (1 of 3)]
[im 1/15]
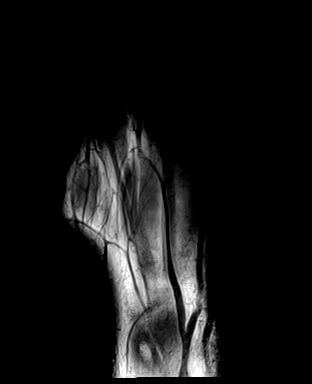
[im 15/15]
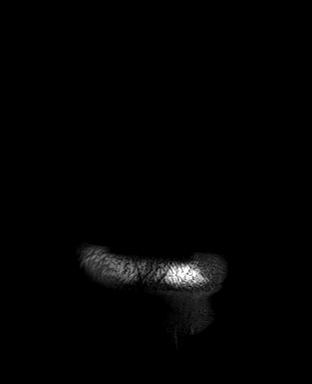

[Series 6: T2 fat-sat · coronal · right · 3.0mm · 0.53mm/px · 2 of 15 slices shown (2 of 3)]
[im 1/15]
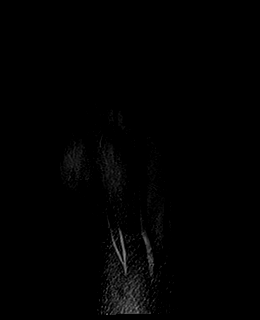
[im 15/15]
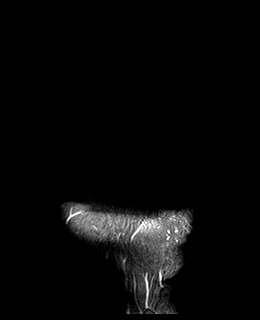

[Series 7: PD fat-sat · coronal · right · 3.0mm · 0.53mm/px · 2 of 15 slices shown]
[im 1/15]
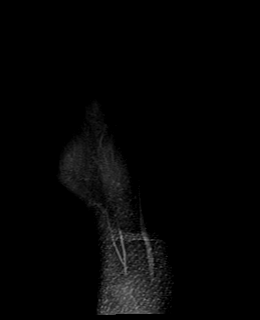
[im 15/15]
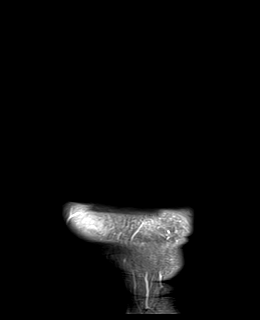

[Series 8: T1 · sagittal · right · 3.0mm · 0.44mm/px · 4 of 33 slices shown (2 of 3)]
[im 1/33]
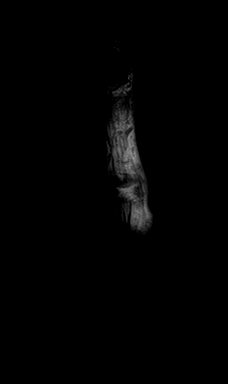
[im 11/33]
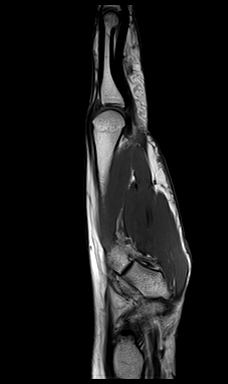
[im 22/33]
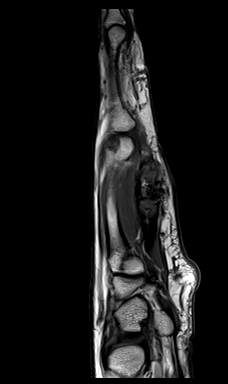
[im 33/33]
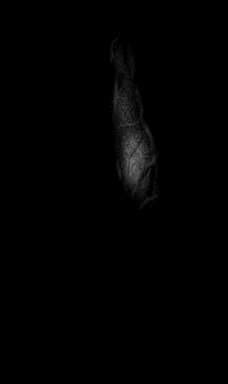

[Series 9: T2 fat-sat · sagittal · right · 3.0mm · 0.50mm/px · 4 of 33 slices shown (3 of 3)]
[im 1/33]
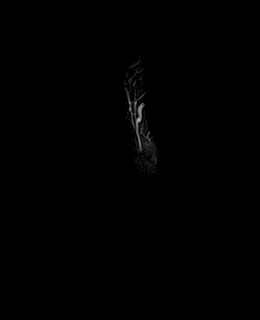
[im 11/33]
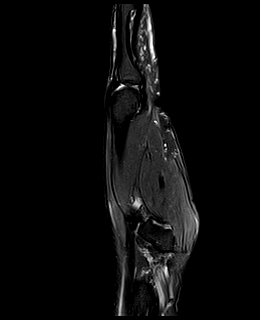
[im 22/33]
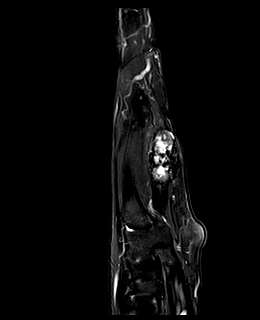
[im 33/33]
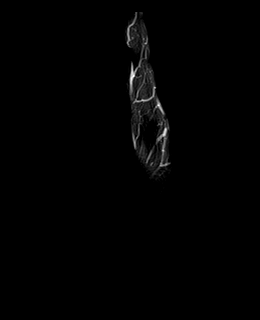

[Series 10: T1 · axial · right · 3.0mm · 0.38mm/px · z∈[-85,+73]mm · 5 of 50 slices shown (3 of 3)]
[im 1/50]
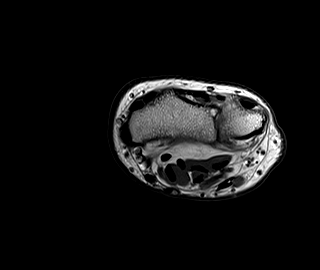
[im 13/50]
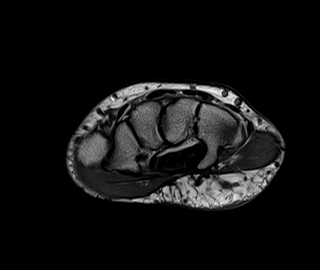
[im 25/50]
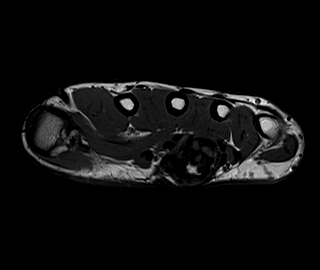
[im 37/50]
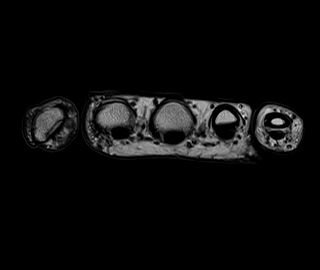
[im 50/50]
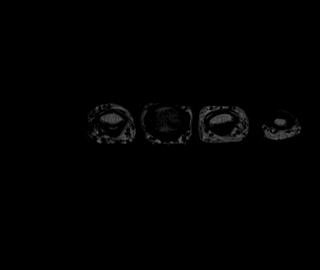

[Series 11: T1 fat-sat · axial · right · 3.0mm · 0.38mm/px · z∈[-85,+73]mm · 5 of 50 slices shown (1 of 4)]
[im 1/50]
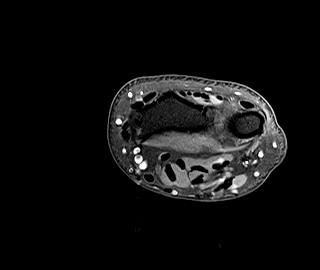
[im 13/50]
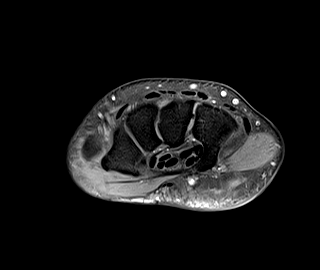
[im 25/50]
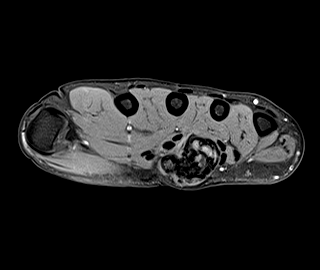
[im 37/50]
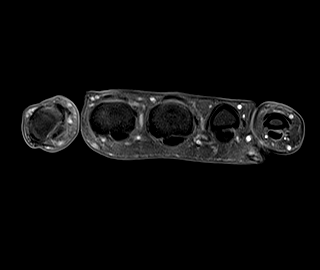
[im 50/50]
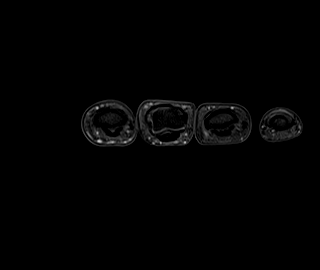

[Series 12: STIR · coronal · right · 3.0mm · 0.53mm/px · 2 of 15 slices shown]
[im 1/15]
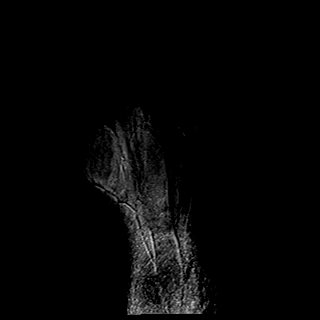
[im 15/15]
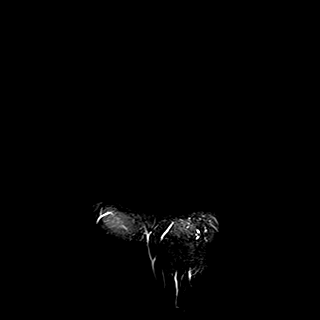

[Series 13: T1 fat-sat · sagittal · right · 3.0mm · 0.44mm/px · 3 of 32 slices shown (2 of 4)]
[im 1/32]
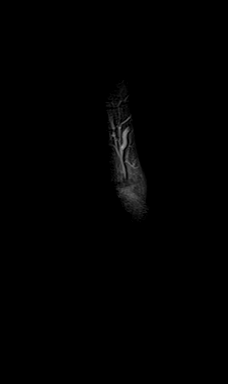
[im 16/32]
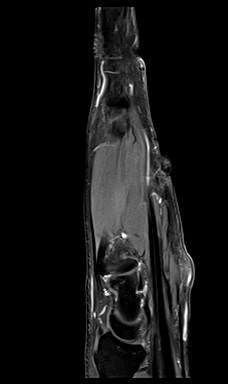
[im 32/32]
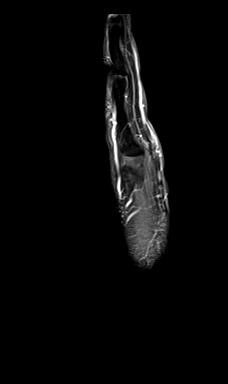

[Series 14: T1 fat-sat · axial · right · 3.0mm · 0.38mm/px · z∈[-85,+73]mm · 5 of 50 slices shown (3 of 4)]
[im 1/50]
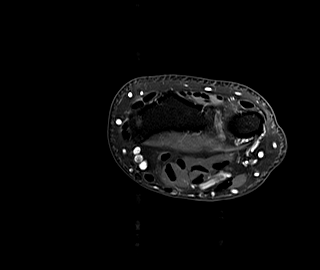
[im 13/50]
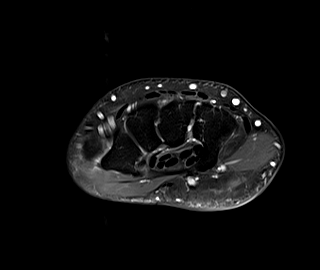
[im 25/50]
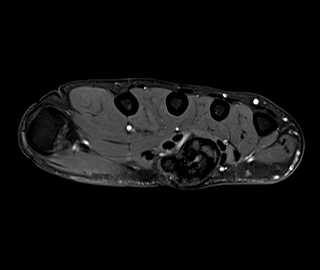
[im 37/50]
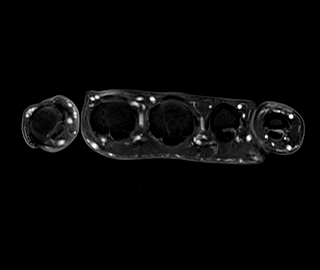
[im 50/50]
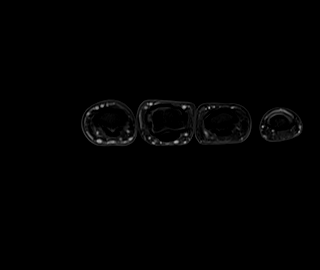

[Series 15: T1 fat-sat · coronal · right · 3.0mm · 0.44mm/px · 2 of 15 slices shown (4 of 4)]
[im 1/15]
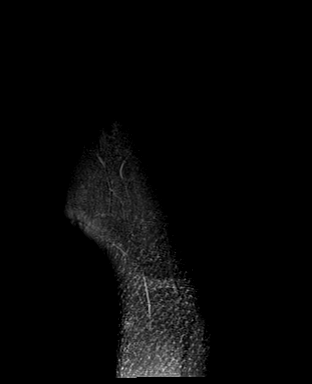
[im 15/15]
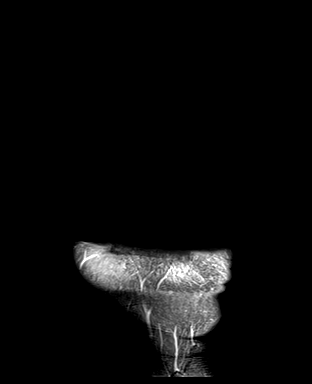

[40 of 40 positions shown; findings below may reference images not displayed]

FINDINGS: Bones/Joint/Cartilage

No significant osseous or articular abnormalities are identified.
The carpal bones are normally aligned. There is no abnormal osseous
or synovial enhancement.

Ligaments

The intercarpal ligaments and collateral ligaments of the metacarpal
phalangeal joints appear intact.

Muscles and Tendons

Palmar hand mass described below is located between the flexor
tendons of the long and ring fingers and results in some splaying of
those tendons. There is no significant tenosynovitis or abnormal
sheath enhancement. The extensor tendons appear normal.

Soft tissues

There is a large, well-circumscribed lobulated mass in the palm of
the hand. This measures 3.1 x 2.6 x 1.8 cm and is located between
the flexor tendons of the long and ring fingers at the mid
metacarpal level. There is a superficial component which extends
exophytically from the palmar aspect of the skin. This mass
demonstrates heterogeneous signal with areas of increased and
decreased T2 signal. There is no enhancement of the lesion following
contrast. There is no intramuscular extension or osseous erosion. No
other soft tissue abnormalities are seen.
IMPRESSION: 1. Well-circumscribed soft tissue mass in the palm of the hand
between the flexor tendons demonstrates no intramuscular extension
or osseous erosion. This mass is partially calcified without
enhancement or other aggressive characteristics.
2. Appearance is nonspecific and not typical for myositis
ossifications or heterotopic ossification. Consider atypical giant
cell tumor (these can occasionally calcify) and extra skeletal
chondroma. Excisional biopsy recommended.
3. No intra-articular abnormalities identified.

## 2019-02-19 ENCOUNTER — Other Ambulatory Visit: Payer: Self-pay

## 2019-02-19 ENCOUNTER — Other Ambulatory Visit: Payer: BC Managed Care – PPO

## 2019-02-19 DIAGNOSIS — B181 Chronic viral hepatitis B without delta-agent: Secondary | ICD-10-CM

## 2019-02-25 LAB — HEPATITIS B DNA, ULTRAQUANTITATIVE, PCR
Hepatitis B DNA (Calc): 3.28 Log IU/mL — ABNORMAL HIGH
Hepatitis B DNA: 1890 IU/mL — ABNORMAL HIGH

## 2019-02-25 LAB — COMPREHENSIVE METABOLIC PANEL
AG Ratio: 1.4 (calc) (ref 1.0–2.5)
ALT: 26 U/L (ref 9–46)
AST: 25 U/L (ref 10–40)
Albumin: 4.6 g/dL (ref 3.6–5.1)
Alkaline phosphatase (APISO): 44 U/L (ref 36–130)
BUN: 14 mg/dL (ref 7–25)
CO2: 26 mmol/L (ref 20–32)
Calcium: 10.1 mg/dL (ref 8.6–10.3)
Chloride: 102 mmol/L (ref 98–110)
Creat: 0.92 mg/dL (ref 0.60–1.35)
Globulin: 3.2 g/dL (calc) (ref 1.9–3.7)
Glucose, Bld: 91 mg/dL (ref 65–99)
Potassium: 4.2 mmol/L (ref 3.5–5.3)
Sodium: 137 mmol/L (ref 135–146)
Total Bilirubin: 0.6 mg/dL (ref 0.2–1.2)
Total Protein: 7.8 g/dL (ref 6.1–8.1)

## 2019-02-25 LAB — HEPATITIS B E ANTIGEN: Hep B E Ag: NONREACTIVE

## 2019-02-25 LAB — HEPATITIS B E ANTIBODY: Hep B E Ab: REACTIVE — AB

## 2019-03-04 ENCOUNTER — Telehealth: Payer: Self-pay

## 2019-03-04 NOTE — Telephone Encounter (Signed)
COVID-19 Pre-Screening Questions:  Do you currently have a fever (>100 F), chills or unexplained body aches? NO   Are you currently experiencing new cough, shortness of breath, sore throat, runny nose?NO .  Have you recently travelled outside the state of [place] in the last 14 days? NO .  Have you been in contact with someone that is currently pending confirmation of Covid19 testing or has been confirmed to have the Covid19 virus?  NO  **If the patient answers NO to ALL questions -  advise the patient to please call the clinic before coming to the office should any symptoms develop.     

## 2019-03-05 ENCOUNTER — Other Ambulatory Visit: Payer: Self-pay

## 2019-03-05 ENCOUNTER — Ambulatory Visit: Payer: BC Managed Care – PPO | Admitting: Internal Medicine

## 2019-03-05 DIAGNOSIS — B181 Chronic viral hepatitis B without delta-agent: Secondary | ICD-10-CM

## 2019-03-05 NOTE — Assessment & Plan Note (Signed)
He still has no clear indication to start therapy for chronic hepatitis B.  An abdominal ultrasound 2 years ago showed hepatic steatosis but no cirrhosis or mass.  I will repeat an ultrasound now and see him back after repeat blood work in 6 months.

## 2019-03-05 NOTE — Progress Notes (Signed)
         [facility] for Infectious Disease  Patient Active Problem List   Diagnosis Date Noted  . Thrombocytopenia ([place]) 07/23/2018  . Chronic viral hepatitis B without delta-agent ([place]) 02/21/2017    Patient's Medications  New Prescriptions   No medications on file  Previous Medications   PANTOPRAZOLE (PROTONIX) 20 MG TABLET    Take 1 tablet (20 mg total) by mouth daily.  Modified Medications   No medications on file  Discontinued Medications   No medications on file    Subjective: Caleb Chandler is in for his routine follow-up visit.  He is feeling well. He is on no medications.  He was working remotely during the initial phase of the Covid pandemic but is now back at work wearing a mask.  Review of Systems: Review of Systems  Constitutional: Negative for chills, diaphoresis, fever and weight loss.  Gastrointestinal: Negative for abdominal pain, diarrhea, nausea and vomiting.  Skin: Negative for rash.    Past Medical History:  Diagnosis Date  . Chronic viral hepatitis B without delta-agent ([place])   . Mass of hand, right 03/2017    Social History   Tobacco Use  . Smoking status: Former Research scientist (life sciences)  . Smokeless tobacco: Never Used  . Tobacco comment: uses hookah rarely  Substance Use Topics  . Alcohol use: No  . Drug use: No    Family History  Family history unknown: Yes    No Known Allergies  Objective: Vitals:   03/05/19 0856  BP: (!) 142/93  Pulse: 71  Temp: 98.4 F (36.9 C)  Weight: 206 lb (93.4 kg)   Body mass index is 33.25 kg/m.  Physical Exam Constitutional:      Comments: He is in good spirits.  Abdominal:     General: There is no distension.     Palpations: Abdomen is soft. There is no mass.     Tenderness: There is no abdominal tenderness.  Skin:    Findings: No rash.  Neurological:     Mental Status: He is alert and oriented to person, place, and time.     Lab Results 02/19/2019 Liver enzymes normal Hepatitis B e antigen negative  Hepatitis B DNA viral load 1890 IU/ml    Problem List Items Addressed This Visit      Unprioritized   Chronic viral hepatitis B without delta-agent ([place])    He still has no clear indication to start therapy for chronic hepatitis B.  An abdominal ultrasound 2 years ago showed hepatic steatosis but no cirrhosis or mass.  I will repeat an ultrasound now and see him back after repeat blood work in 6 months.      Relevant Orders   US Abdomen Complete   Comprehensive metabolic panel   Hepatitis B e antigen   Hepatitis B e antibody   Hepatitis B DNA, ultraquantitative, PCR   Liver Fibrosis, FibroTest-ActiTest       Caleb Bickers, MD [facility] for [facility] [place] [facility] [phone] pager   [phone] cell 03/05/2019, 9:12 AM

## 2019-03-12 ENCOUNTER — Other Ambulatory Visit: Payer: BC Managed Care – PPO

## 2019-03-18 ENCOUNTER — Ambulatory Visit
Admission: RE | Admit: 2019-03-18 | Discharge: 2019-03-18 | Disposition: A | Discharge status: Home / Self Care | Payer: BC Managed Care – PPO | Source: Ambulatory Visit | Attending: Internal Medicine | Admitting: Internal Medicine

## 2019-03-18 DIAGNOSIS — B181 Chronic viral hepatitis B without delta-agent: Secondary | ICD-10-CM

## 2019-04-24 ENCOUNTER — Other Ambulatory Visit: Payer: BC Managed Care – PPO

## 2019-05-08 ENCOUNTER — Encounter: Payer: BC Managed Care – PPO | Admitting: Internal Medicine

## 2019-07-03 ENCOUNTER — Ambulatory Visit: Payer: BC Managed Care – PPO | Attending: Internal Medicine

## 2019-07-03 DIAGNOSIS — Z20822 Contact with and (suspected) exposure to covid-19: Secondary | ICD-10-CM

## 2019-07-04 ENCOUNTER — Ambulatory Visit: Payer: BC Managed Care – PPO

## 2019-07-04 LAB — NOVEL CORONAVIRUS, NAA: SARS-CoV-2, NAA: NOT DETECTED

## 2019-07-05 ENCOUNTER — Ambulatory Visit: Payer: BC Managed Care – PPO

## 2019-09-03 ENCOUNTER — Other Ambulatory Visit: Payer: BC Managed Care – PPO

## 2019-09-03 ENCOUNTER — Other Ambulatory Visit: Payer: Self-pay

## 2019-09-03 DIAGNOSIS — B181 Chronic viral hepatitis B without delta-agent: Secondary | ICD-10-CM

## 2019-09-03 LAB — COMPREHENSIVE METABOLIC PANEL
AST: 23 U/L (ref 10–40)
Alkaline phosphatase (APISO): 47 U/L (ref 36–130)
CO2: 27 mmol/L (ref 20–32)
Chloride: 104 mmol/L (ref 98–110)
Creat: 0.95 mg/dL (ref 0.60–1.35)
Globulin: 3.2 g/dL (calc) (ref 1.9–3.7)
Glucose, Bld: 83 mg/dL (ref 65–99)
Sodium: 138 mmol/L (ref 135–146)
Total Protein: 7.5 g/dL (ref 6.1–8.1)

## 2019-09-07 LAB — HEPATITIS B E ANTIGEN: Hep B E Ag: NONREACTIVE

## 2019-09-07 LAB — LIVER FIBROSIS, FIBROTEST-ACTITEST
ALT: 35 U/L (ref 9–46)
Alpha-2-Macroglobulin: 215 mg/dL (ref 106–279)
Apolipoprotein A1: 105 mg/dL (ref 94–176)
Bilirubin: 0.4 mg/dL (ref 0.2–1.2)
Fibrosis Score: 0.2
GGT: 17 U/L (ref 3–90)
Haptoglobin: 125 mg/dL (ref 43–212)
Necroinflammat ACT Score: 0.16
Reference ID: 3363030

## 2019-09-07 LAB — COMPREHENSIVE METABOLIC PANEL
AG Ratio: 1.3 (calc) (ref 1.0–2.5)
ALT: 36 U/L (ref 9–46)
Albumin: 4.3 g/dL (ref 3.6–5.1)
BUN: 20 mg/dL (ref 7–25)
Calcium: 9.8 mg/dL (ref 8.6–10.3)
Potassium: 4.7 mmol/L (ref 3.5–5.3)
Total Bilirubin: 0.4 mg/dL (ref 0.2–1.2)

## 2019-09-07 LAB — HEPATITIS B DNA, ULTRAQUANTITATIVE, PCR
Hepatitis B DNA (Calc): 2.24 Log IU/mL — ABNORMAL HIGH
Hepatitis B DNA: 175 IU/mL — ABNORMAL HIGH

## 2019-09-07 LAB — HEPATITIS B E ANTIBODY: Hep B E Ab: REACTIVE — AB

## 2019-09-16 ENCOUNTER — Telehealth: Payer: Self-pay

## 2019-09-16 NOTE — Telephone Encounter (Signed)
COVID-19 Pre-Screening Questions:09/16/19  Do you currently have a fever (>100 F), chills or unexplained body aches?NO  . Are you currently experiencing new cough, shortness of breath, sore throat, runny nose? NO .  Have you recently travelled outside the state of [place] in the last 14 days?NO .  Have you been in contact with someone that is currently pending confirmation of Covid19 testing or has been confirmed to have the [place] virus? NO  **If the patient answers NO to ALL questions -  advise the patient to please call the clinic before coming to the office should any symptoms develop.

## 2019-09-17 ENCOUNTER — Ambulatory Visit: Payer: BC Managed Care – PPO | Admitting: Internal Medicine

## 2019-09-17 ENCOUNTER — Other Ambulatory Visit: Payer: Self-pay

## 2019-09-17 ENCOUNTER — Encounter: Payer: Self-pay | Admitting: Internal Medicine

## 2019-09-17 DIAGNOSIS — B181 Chronic viral hepatitis B without delta-agent: Secondary | ICD-10-CM | POA: Diagnosis not present

## 2019-09-17 NOTE — Progress Notes (Signed)
         [facility]  Patient Active Problem List   Diagnosis Date Noted  . Thrombocytopenia ([place]) 07/23/2018  . Chronic viral hepatitis B without delta-agent ([place]) 02/21/2017    Patient's Medications  New Prescriptions   No medications on file  Previous Medications   No medications on file  Modified Medications   No medications on file  Discontinued Medications   PANTOPRAZOLE (PROTONIX) 20 MG TABLET    Take 1 tablet (20 mg total) by mouth daily.    Subjective: Caleb Chandler is in for his routine follow-up visit.  He is feeling well.  He has received both doses of his Covid vaccine.  Review of Systems: Review of Systems  Constitutional: Negative for chills, diaphoresis, fever and weight loss.  Gastrointestinal: Negative for abdominal pain, diarrhea, nausea and vomiting.  Skin: Negative for rash.    Past Medical History:  Diagnosis Date  . Chronic viral hepatitis B without delta-agent ([place])   . Mass of hand, right 03/2017    Social History   Tobacco Use  . Smoking status: Former Research scientist (life sciences)  . Smokeless tobacco: Never Used  . Tobacco comment: uses hookah rarely  Substance Use Topics  . Alcohol use: No  . Drug use: No    Family History  Family history unknown: Yes    No Known Allergies  Objective: Vitals:   09/17/19 0902  Pulse: 72  SpO2: 98%  Weight: 191 lb (86.6 kg)  Height: 5\' 6"  (1.676 m)   Body mass index is 30.83 kg/m.  Physical Exam Constitutional:      Comments: He is in good spirits.  Abdominal:     General: There is no distension.     Palpations: Abdomen is soft. There is no mass.     Tenderness: There is no abdominal tenderness.  Skin:    Findings: No rash.  Neurological:     Mental Status: He is alert and oriented to person, place, and time.     Lab Results 09/03/2019 we will put in orders on Caleb Chandler wound Liver enzymes normal Hepatitis B e antigen negative Hepatitis B DNA viral load 175 IU/ml    Problem List  Items Addressed This Visit      Unprioritized   Chronic viral hepatitis B without delta-agent ([place])    His chronic hepatitis B remains under excellent control.  I do not think there is significant benefit of starting antiviral therapy at this time.  His ultrasound last November showed mild hepatic steatosis but no other changes.  He will follow-up after lab work in 6 months.          Michel Bickers, MD [facility] [phone] pager   [phone] cell 09/17/2019, 9:17 AM

## 2019-09-17 NOTE — Assessment & Plan Note (Signed)
His chronic hepatitis B remains under excellent control.  I do not think there is significant benefit of starting antiviral therapy at this time.  His ultrasound last November showed mild hepatic steatosis but no other changes.  He will follow-up after lab work in 6 months.

## 2020-01-28 ENCOUNTER — Encounter (INDEPENDENT_AMBULATORY_CARE_PROVIDER_SITE_OTHER): Payer: Self-pay | Admitting: Primary Care

## 2020-01-28 ENCOUNTER — Other Ambulatory Visit: Payer: Self-pay

## 2020-01-28 ENCOUNTER — Ambulatory Visit (INDEPENDENT_AMBULATORY_CARE_PROVIDER_SITE_OTHER): Payer: BC Managed Care – PPO | Admitting: Primary Care

## 2020-01-28 VITALS — BP 113/79 | HR 75 | Temp 98.2°F | Ht 66.0 in | Wt 184.0 lb

## 2020-01-28 DIAGNOSIS — R079 Chest pain, unspecified: Secondary | ICD-10-CM | POA: Diagnosis not present

## 2020-01-28 DIAGNOSIS — Z23 Encounter for immunization: Secondary | ICD-10-CM | POA: Diagnosis not present

## 2020-01-28 MED ORDER — OMEPRAZOLE 20 MG PO CPDR: 20.0000 mg | DELAYED_RELEASE_CAPSULE | Freq: Every day | ORAL | 3 refills | Status: DC

## 2020-01-28 MED ORDER — IBUPROFEN 600 MG PO TABS: 600.0000 mg | ORAL_TABLET | Freq: Three times a day (TID) | ORAL | 0 refills | Status: DC | PRN

## 2020-01-28 NOTE — Patient Instructions (Signed)

## 2020-01-28 NOTE — Progress Notes (Signed)
Established Patient Office Visit  Subjective:  Patient ID: Caleb Chandler, male    DOB: 03-Apr-1981  Age: 39 y.o. MRN: [id number]  CC:  Chief Complaint  Patient presents with   Dizziness    when he stretches    Chest Pain    can stretch it out    Acne    back of head    HPI Caleb Chandler is 39 year old male with intermediate chest at the center chest to recipitate pain, he denies shortness of breath but sensation.  Denies radiation to the neck back or left arm.  Denies shortness of breath.  Patient did share that his wife is pregnant.  And positive for rubella however reported that migraine IS RECEIVED vaccine to include MMR  Past Medical History:  Diagnosis Date   Chronic viral hepatitis B without delta-agent ([place])    Mass of hand, right 03/2017    Past Surgical History:  Procedure Laterality Date   EXCISION METACARPAL MASS Right 04/03/2017   Procedure: RIGHT HAND MASS EXCISION;  Surgeon: Milly Jakob, MD;  Location: [facility];  Service: Orthopedics;  Laterality: Right;    Family History  Family history unknown: Yes    Social History   Socioeconomic History   Marital status: Married    Spouse name: Not on file   Number of children: Not on file   Years of education: Not on file   Highest education level: Not on file  Occupational History   Not on file  Tobacco Use   Smoking status: Former Smoker   Smokeless tobacco: Never Used   Tobacco comment: uses hookah rarely  Vaping Use   Vaping Use: Never used  Substance and Sexual Activity   Alcohol use: No   Drug use: No   Sexual activity: Not Currently    Comment: none x 6 months  Other Topics Concern   Not on file  Social History Narrative   Not on file   Social Determinants of Health   Financial Resource Strain:    Difficulty of Paying Living Expenses: Not on file  Food Insecurity:    Worried About Charity fundraiser in the Last Year: Not on file   [place] in the Last Year: Not on file  Transportation Needs:    Lack of Transportation (Medical): Not on file   Lack of Transportation (Non-Medical): Not on file  Physical Activity:    Days of Exercise per Week: Not on file   Minutes of Exercise per Session: Not on file  Stress:    Feeling of Stress : Not on file  Social Connections:    Frequency of Communication with Friends and Family: Not on file   Frequency of Social Gatherings with Friends and Family: Not on file   Attends Religious Services: Not on file   Active Member of Clubs or Organizations: Not on file   Attends Archivist Meetings: Not on file   Marital Status: Not on file  Intimate Partner Violence:    Fear of Current or Ex-Partner: Not on file   Emotionally Abused: Not on file   Physically Abused: Not on file   Sexually Abused: Not on file    No outpatient medications prior to visit.   No facility-administered medications prior to visit.    No Known Allergies  ROS Review of Systems  Respiratory: Positive for cough and chest tightness.   Cardiovascular: Positive for chest pain.  All other systems reviewed and  are negative.     Objective:    Physical Exam Vitals reviewed.  HENT:     Head: Normocephalic.  Eyes:     Extraocular Movements: Extraocular movements intact.     Pupils: Pupils are equal, round, and reactive to light.  Cardiovascular:     Rate and Rhythm: Normal rate and regular rhythm.     Heart sounds: Normal heart sounds.  Pulmonary:     Effort: Pulmonary effort is normal.     Breath sounds: Normal breath sounds.  Abdominal:     General: Bowel sounds are normal.  Musculoskeletal:        General: Normal range of motion.     Cervical back: Normal range of motion and neck supple.  Skin:    General: Skin is warm and dry.  Neurological:     Mental Status: He is alert and oriented to person, place, and time.  Psychiatric:        Mood and Affect: Mood normal.         Behavior: Behavior normal.     BP 113/79 (BP Location: Right Arm, Patient Position: Sitting, Cuff Size: Normal)    Pulse 75    Temp 98.2 F (36.8 C) (Oral)    Ht 5' 6"  (1.676 m)    Wt 184 lb (83.5 kg)    SpO2 96%    BMI 29.70 kg/m  Wt Readings from Last 3 Encounters:  01/28/20 184 lb (83.5 kg)  09/17/19 191 lb (86.6 kg)  03/05/19 206 lb (93.4 kg)     There are no preventive care reminders to display for this patient.  There are no preventive care reminders to display for this patient.  Lab Results  Component Value Date   TSH 1.610 07/23/2018   Lab Results  Component Value Date   WBC 5.3 07/23/2018   HGB 16.4 07/23/2018   HCT 45.1 07/23/2018   MCV 87 07/23/2018   PLT 156 07/23/2018   Lab Results  Component Value Date   NA 138 09/03/2019   K 4.7 09/03/2019   CO2 27 09/03/2019   GLUCOSE 83 09/03/2019   BUN 20 09/03/2019   CREATININE 0.95 09/03/2019   BILITOT 0.4 09/03/2019   ALKPHOS 56 07/23/2018   AST 23 09/03/2019   ALT 36 09/03/2019   ALT 35 09/03/2019   PROT 7.5 09/03/2019   ALBUMIN 4.7 07/23/2018   CALCIUM 9.8 09/03/2019   ANIONGAP 8 03/29/2017   No results found for: CHOL No results found for: HDL No results found for: LDLCALC No results found for: TRIG No results found for: CHOLHDL Lab Results  Component Value Date   HGBA1C 5.3 07/23/2018      Assessment & Plan:  Caleb Chandler was seen today for dizziness, chest pain and acne.  Diagnoses and all orders for this visit:  Need for immunization against influenza -     Flu Vaccine QUAD 36+ mos IM  Chest pain, unspecified type Consulted with cardiology although EKG read abnormal cardiology reviewed stated normal.  May be caused by increased stress wife out of the country pregnant and has rubella. -     EKG 12-Lead -     ibuprofen (ADVIL) 600 MG tablet; Take 1 tablet (600 mg total) by mouth every 8 (eight) hours as needed. -     omeprazole (PRILOSEC) 20 MG capsule; Take 1 capsule (20 mg total) by mouth  daily. Consulted with infectious disease he is followed by Dr. Megan Salon for hepatitis B patient has been vaccinated  although has been with wife not concerning   Follow-up: Return if symptoms worsen or fail to improve.    Kerin Perna, NP

## 2020-02-04 ENCOUNTER — Encounter (INDEPENDENT_AMBULATORY_CARE_PROVIDER_SITE_OTHER): Payer: Self-pay | Admitting: Primary Care

## 2020-02-04 ENCOUNTER — Other Ambulatory Visit: Payer: Self-pay

## 2020-02-04 ENCOUNTER — Ambulatory Visit (INDEPENDENT_AMBULATORY_CARE_PROVIDER_SITE_OTHER): Payer: BC Managed Care – PPO | Admitting: Primary Care

## 2020-02-04 VITALS — BP 116/80 | HR 69 | Temp 97.3°F | Ht 66.0 in | Wt 185.6 lb

## 2020-02-04 DIAGNOSIS — R079 Chest pain, unspecified: Secondary | ICD-10-CM | POA: Diagnosis not present

## 2020-02-04 DIAGNOSIS — L738 Other specified follicular disorders: Secondary | ICD-10-CM

## 2020-02-04 DIAGNOSIS — Z0000 Encounter for general adult medical examination without abnormal findings: Secondary | ICD-10-CM | POA: Diagnosis not present

## 2020-02-04 DIAGNOSIS — R42 Dizziness and giddiness: Secondary | ICD-10-CM | POA: Diagnosis not present

## 2020-02-04 DIAGNOSIS — Z13220 Encounter for screening for lipoid disorders: Secondary | ICD-10-CM

## 2020-02-04 DIAGNOSIS — B181 Chronic viral hepatitis B without delta-agent: Secondary | ICD-10-CM

## 2020-02-04 NOTE — Progress Notes (Signed)
[facility]    Mr. Caleb Chandler is a 39 y.o. male who presents for  `Annual/Subsequent preventive examination.   Preventive Screening-Counseling & Management  Tobacco Social History   Tobacco Use  Smoking Status Former Smoker  Smokeless Tobacco Never Used  Tobacco Comment   uses hookah rarely   iCurrent Problems (verified) Patient Active Problem List   Diagnosis Date Noted  . Thrombocytopenia ([place]) 07/23/2018  . Chronic viral hepatitis B without delta-agent ([place]) 02/21/2017    Medications Prior to Visit Current Outpatient Medications on File Prior to Visit  Medication Sig Dispense Refill  . ibuprofen (ADVIL) 600 MG tablet Take 1 tablet (600 mg total) by mouth every 8 (eight) hours as needed. 30 tablet 0  . omeprazole (PRILOSEC) 20 MG capsule Take 1 capsule (20 mg total) by mouth daily. 30 capsule 3   No current facility-administered medications on file prior to visit.    Current Medications (verified) Current Outpatient Medications  Medication Sig Dispense Refill  . ibuprofen (ADVIL) 600 MG tablet Take 1 tablet (600 mg total) by mouth every 8 (eight) hours as needed. 30 tablet 0  . omeprazole (PRILOSEC) 20 MG capsule Take 1 capsule (20 mg total) by mouth daily. 30 capsule 3   No current facility-administered medications for this visit.     Allergies (verified) Patient has no known allergies.   PAST HISTORY Past Medical History:  Diagnosis Date  . Chronic viral hepatitis B without delta-agent ([place])   . Mass of hand, right 03/2017   Family History Family History  Family history unknown: Yes    Social History Social History   Tobacco Use  . Smoking status: Former Research scientist (life sciences)  . Smokeless tobacco: Never Used  . Tobacco comment: uses hookah rarely  Substance Use Topics  . Alcohol use: No    Are there smokers in your home (other than you)?  Yes  Risk Factors Current exercise habits: Home exercise routine includes walking 2 hrs  3 DAYS per  week.  Dietary issues discussed:  Cardiac risk factors: male gender.  Depression Screen   Office Visit from 02/04/2020 in [facility]  PHQ-9 Total Score 0     Activities of Daily Living In your present state of health, do you have any difficulty performing the following activities?:  Driving? Yes Managing money?  Yes Feeding yourself? Yes Getting from bed to chair? Yes Climbing a flight of stairs? Yes Preparing food and eating?: Yes Bathing or showering? Yes Getting dressed: Yes Getting to the toilet? Yes Using the toilet:Yes Moving around from place to place: No In the past year have you fallen or had a near fall?:No   Are you sexually active?  Yes  Do you have more than one partner?  No  Hearing Difficulties: No Do you often ask people to speak up or repeat themselves? No Do you experience ringing or noises in your ears? No Do you have difficulty understanding soft or whispered voices? No   Do you feel that you have a problem with memory? No  Do you often misplace items? No  Do you feel safe at home?  No  Cognitive Testing  Alert? Yes  Normal Appearance?Yes  Oriented to person? Yes  Place? Yes   Time? Yes  Recall of three objects?  Yes telephone care shoes   Can perform simple calculations? Yes  Displays appropriate judgment?Yes  Can read the correct time from a watch face?Yes   Advanced Directives have been discussed with  the patient? No   List the Names of Other Physician/Practitioners you currently use: None  Immunization History  Administered Date(s) Administered  . Hepatitis A 01/19/2017  . Influenza, Quadrivalent, Recombinant, Inj, Pf 03/04/2019  . Influenza,inj,Quad PF,6+ Mos 07/23/2018, 01/28/2020  . Influenza-Unspecified 02/17/2017  . MMR 01/19/2017  . PFIZER SARS-COV-2 Vaccination 06/05/2019, 06/26/2019  . Td 01/19/2017, 02/17/2017  . Tdap 01/19/2017    Screening Tests Health Maintenance  Topic Date Due  . TETANUS/TDAP   02/18/2027  . INFLUENZA VACCINE  Completed  . COVID-19 Vaccine  Completed  . Hepatitis C Screening  Completed  . HIV Screening  Completed    All answers were reviewed with the patient and necessary referrals were made:  Kerin Perna, NP   02/04/2020   History reviewed: allergies, current medications, past family history, past medical history, past social history, past surgical history and problem list  Review of Systems Pertinent items noted in HPI and remainder of comprehensive ROS otherwise negative.   Review of Systems  Cardiovascular: Positive for chest pain.       Esp when he is swallowing increase with sitting   Gastrointestinal: Positive for heartburn.  Neurological: Positive for dizziness and weakness.       When this take place he has to hold on to something or he will fall   Objective:   Blood pressure 116/80, pulse 69, temperature (!) 97.3 F (36.3 C), temperature source Temporal, height _0  (1.676 m), weight 185 lb 9.6 oz (84.2 kg), SpO2 97 %. Body mass index is 29.96 kg/m.     Assessment:  Physical exam   General: Vital signs reviewed.  Patient is well-developed and well-nourished,Body mass index is 29.96 kg/m. male in no acute distress and cooperative with exam.  Head: Normocephalic and atraumatic. Eyes: EOMI, conjunctivae normal, no scleral icterus.  Neck: Supple, trachea midline, normal ROM, no JVD, masses, thyromegaly, or carotid bruit present.  Cardiovascular: RRR, S1 normal, S2 normal, no murmurs, gallops, or rubs. Pulmonary/Chest: Clear to auscultation bilaterally, no wheezes, rales, or rhonchi. Abdominal: Soft, non-tender, non-distended, BS +, no masses, organomegaly, or guarding present.  Musculoskeletal: No joint deformities, erythema, or stiffness, ROM full and nontender. Extremities: No lower extremity edema bilaterally,  pulses symmetric and intact bilaterally. No cyanosis or clubbing. Neurological: A&O x3, Strength is normal and symmetric  bilaterally, cranial nerve II-XII are grossly intact, no focal motor deficit, sensory intact to light touch bilaterally.  Skin: Warm, dry and intact. No rashes or erythema. Psychiatric: Normal mood and affect. speech and behavior is normal. Cognition and memory are normal.    Plan:  Hines was seen today for annual exam.  Diagnoses and all orders for this visit:  Annual physical exam Completed   Chest pain, unspecified type Consulted previously with cardiology EKG was normal however patient continues to complain of chest pain without shortness of breath does have radiation to shoulder,  mid back and neck -     Ambulatory referral to Cardiology  Dizzy spells Unknown etiology other than working 12 hours, stress over having a baby in another country and wife has rubella. Discussed changing in positions quickly can cause dizzy spells, not eating or hydrating. Unknown etiology. Advised to keep a record of when this occurs and what he is doing  Screening for lipid disorders -     Lipid panel; Future  Chronic viral hepatitis B without delta agent and without coma ([place]) Followed by infectious disease Dr. Megan Salon  Folliculitis barbae On the neck of  the patient stated stated he had acne-who did his haircuts. He stated his self. Lasted by cleaning shavers thoroughly before reusing them. Explained to him it was folliculitis barbae which may be caused by shaving cutting of the hair or applying ointments or cream to the hair.    During the course of the visit the patient was educated and counseled about appropriate screening and preventive services including:    Kerin Perna, NP   02/04/2020

## 2020-02-04 NOTE — Patient Instructions (Signed)
Folliculitis  Folliculitis is inflammation of the hair follicles. Folliculitis most commonly occurs on the scalp, thighs, legs, back, and buttocks. However, it can occur anywhere on the body. What are the causes? This condition may be caused by:  A bacterial infection (common).  A fungal infection.  A viral infection.  Contact with certain chemicals, especially oils and tars.  Shaving or waxing.  Greasy ointments or creams applied to the skin. Long-lasting folliculitis and folliculitis that keeps coming back may be caused by bacteria. This bacteria can live anywhere on your skin and is often found in the nostrils. What increases the risk? You are more likely to develop this condition if you have:  A weakened immune system.  Diabetes.  Obesity. What are the signs or symptoms? Symptoms of this condition include:  Redness.  Soreness.  Swelling.  Itching.  Small white or yellow, pus-filled, itchy spots (pustules) that appear over a reddened area. If there is an infection that goes deep into the follicle, these may develop into a boil (furuncle).  A group of closely packed boils (carbuncle). These tend to form in hairy, sweaty areas of the body. How is this diagnosed? This condition is diagnosed with a skin exam. To find what is causing the condition, your health care provider may take a sample of one of the pustules or boils for testing in a lab. How is this treated? This condition may be treated by:  Applying warm compresses to the affected areas.  Taking an antibiotic medicine or applying an antibiotic medicine to the skin.  Applying or bathing with an antiseptic solution.  Taking an over-the-counter medicine to help with itching.  Having a procedure to drain any pustules or boils. This may be done if a pustule or boil contains a lot of pus or fluid.  Having laser hair removal. This may be done to treat long-lasting folliculitis. Follow these instructions at  home: Managing pain and swelling   If directed, apply heat to the affected area as often as told by your health care provider. Use the heat source that your health care provider recommends, such as a moist heat pack or a heating pad. ? Place a towel between your skin and the heat source. ? Leave the heat on for 20-30 minutes. ? Remove the heat if your skin turns bright red. This is especially important if you are unable to feel pain, heat, or cold. You may have a greater risk of getting burned. General instructions  If you were prescribed an antibiotic medicine, take it or apply it as told by your health care provider. Do not stop using the antibiotic even if your condition improves.  Check the irritated area every day for signs of infection. Check for: ? Redness, swelling, or pain. ? Fluid or blood. ? Warmth. ? Pus or a bad smell.  Do not shave irritated skin.  Take over-the-counter and prescription medicines only as told by your health care provider.  Keep all follow-up visits as told by your health care provider. This is important. Get help right away if:  You have more redness, swelling, or pain in the affected area.  Red streaks are spreading from the affected area.  You have a fever. Summary  Folliculitis is inflammation of the hair follicles. Folliculitis most commonly occurs on the scalp, thighs, legs, back, and buttocks.  This condition may be treated by taking an antibiotic medicine or applying an antibiotic medicine to the skin, and applying or bathing with an antiseptic   solution.  If you were prescribed an antibiotic medicine, take it or apply it as told by your health care provider. Do not stop using the antibiotic even if your condition improves.  Get help right away if you have new or worsening symptoms.  Keep all follow-up visits as told by your health care provider. This is important. This information is not intended to replace advice given to you by your  health care provider. Make sure you discuss any questions you have with your health care provider. Document Revised: 12/09/2017 Document Reviewed: 12/09/2017 Elsevier Patient Education  2020 [place] Maintenance, Male Adopting a healthy lifestyle and getting preventive care are important in promoting health and wellness. Ask your health care provider about:  The right schedule for you to have regular tests and exams.  Things you can do on your own to prevent diseases and keep yourself healthy. What should I know about diet, weight, and exercise? Eat a healthy diet   Eat a diet that includes plenty of vegetables, fruits, low-fat dairy products, and lean protein.  Do not eat a lot of foods that are high in solid fats, added sugars, or sodium. Maintain a healthy weight Body mass index (BMI) is a measurement that can be used to identify possible weight problems. It estimates body fat based on height and weight. Your health care provider can help determine your BMI and help you achieve or maintain a healthy weight. Get regular exercise Get regular exercise. This is one of the most important things you can do for your health. Most adults should:  Exercise for at least 150 minutes each week. The exercise should increase your heart rate and make you sweat (moderate-intensity exercise).  Do strengthening exercises at least twice a week. This is in addition to the moderate-intensity exercise.  Spend less time sitting. Even light physical activity can be beneficial. Watch cholesterol and blood lipids Have your blood tested for lipids and cholesterol at 38 years of age, then have this test every 5 years. You may need to have your cholesterol levels checked more often if:  Your lipid or cholesterol levels are high.  You are older than 38 years of age.  You are at high risk for heart disease. What should I know about cancer screening? Many types of cancers can be detected early and  may often be prevented. Depending on your health history and family history, you may need to have cancer screening at various ages. This may include screening for:  Colorectal cancer.  Prostate cancer.  Skin cancer.  Lung cancer. What should I know about heart disease, diabetes, and high blood pressure? Blood pressure and heart disease  High blood pressure causes heart disease and increases the risk of stroke. This is more likely to develop in people who have high blood pressure readings, are of African descent, or are overweight.  Talk with your health care provider about your target blood pressure readings.  Have your blood pressure checked: ? Every 3-5 years if you are 8-56 years of age. ? Every year if you are 58 years old or older.  If you are between the ages of 54 and 51 and are a current or former smoker, ask your health care provider if you should have a one-time screening for abdominal aortic aneurysm (AAA). Diabetes Have regular diabetes screenings. This checks your fasting blood sugar level. Have the screening done:  Once every three years after age 60 if you are at a normal weight and  have a low risk for diabetes.  More often and at a younger age if you are overweight or have a high risk for diabetes. What should I know about preventing infection? Hepatitis B If you have a higher risk for hepatitis B, you should be screened for this virus. Talk with your health care provider to find out if you are at risk for hepatitis B infection. Hepatitis C Blood testing is recommended for:  Everyone born from [birth year] through 1964.  Anyone with known risk factors for hepatitis C. Sexually transmitted infections (STIs)  You should be screened each year for STIs, including gonorrhea and chlamydia, if: ? You are sexually active and are younger than 38 years of age. ? You are older than 38 years of age and your health care provider tells you that you are at risk for this type of  infection. ? Your sexual activity has changed since you were last screened, and you are at increased risk for chlamydia or gonorrhea. Ask your health care provider if you are at risk.  Ask your health care provider about whether you are at high risk for HIV. Your health care provider may recommend a prescription medicine to help prevent HIV infection. If you choose to take medicine to prevent HIV, you should first get tested for HIV. You should then be tested every 3 months for as long as you are taking the medicine. Follow these instructions at home: Lifestyle  Do not use any products that contain nicotine or tobacco, such as cigarettes, e-cigarettes, and chewing tobacco. If you need help quitting, ask your health care provider.  Do not use street drugs.  Do not share needles.  Ask your health care provider for help if you need support or information about quitting drugs. Alcohol use  Do not drink alcohol if your health care provider tells you not to drink.  If you drink alcohol: ? Limit how much you have to 0-2 drinks a day. ? Be aware of how much alcohol is in your drink. In the [place] one drink equals one 12 oz bottle of beer (355 mL), one 5 oz glass of wine (148 mL), or one 1 oz glass of hard liquor (44 mL). General instructions  Schedule regular health, dental, and eye exams.  Stay current with your vaccines.  Tell your health care provider if: ? You often feel depressed. ? You have ever been abused or do not feel safe at home. Summary  Adopting a healthy lifestyle and getting preventive care are important in promoting health and wellness.  Follow your health care provider's instructions about healthy diet, exercising, and getting tested or screened for diseases.  Follow your health care provider's instructions on monitoring your cholesterol and blood pressure. This information is not intended to replace advice given to you by your health care provider. Make sure you  discuss any questions you have with your health care provider. Document Revised: 04/25/2018 Document Reviewed: 04/25/2018 Elsevier Patient Education  2020 [employer].

## 2020-03-02 ENCOUNTER — Other Ambulatory Visit: Payer: Self-pay

## 2020-03-02 ENCOUNTER — Other Ambulatory Visit: Payer: BC Managed Care – PPO

## 2020-03-02 DIAGNOSIS — B181 Chronic viral hepatitis B without delta-agent: Secondary | ICD-10-CM

## 2020-03-04 LAB — COMPREHENSIVE METABOLIC PANEL
AG Ratio: 1.5 (calc) (ref 1.0–2.5)
ALT: 14 U/L (ref 9–46)
AST: 20 U/L (ref 10–40)
Albumin: 4.4 g/dL (ref 3.6–5.1)
Alkaline phosphatase (APISO): 39 U/L (ref 36–130)
BUN: 20 mg/dL (ref 7–25)
CO2: 26 mmol/L (ref 20–32)
Calcium: 9.6 mg/dL (ref 8.6–10.3)
Chloride: 104 mmol/L (ref 98–110)
Creat: 0.93 mg/dL (ref 0.60–1.35)
Globulin: 2.9 g/dL (calc) (ref 1.9–3.7)
Glucose, Bld: 86 mg/dL (ref 65–99)
Potassium: 4 mmol/L (ref 3.5–5.3)
Sodium: 140 mmol/L (ref 135–146)
Total Bilirubin: 0.6 mg/dL (ref 0.2–1.2)
Total Protein: 7.3 g/dL (ref 6.1–8.1)

## 2020-03-04 LAB — HEPATITIS B DNA, ULTRAQUANTITATIVE, PCR
Hepatitis B DNA (Calc): 3 Log IU/mL — ABNORMAL HIGH
Hepatitis B DNA: 1010 IU/mL — ABNORMAL HIGH

## 2020-03-04 LAB — HEPATITIS B E ANTIBODY: Hep B E Ab: REACTIVE — AB

## 2020-03-18 ENCOUNTER — Other Ambulatory Visit: Payer: Self-pay

## 2020-03-18 ENCOUNTER — Encounter: Payer: Self-pay | Admitting: Internal Medicine

## 2020-03-18 ENCOUNTER — Ambulatory Visit: Payer: BC Managed Care – PPO | Admitting: Internal Medicine

## 2020-03-18 DIAGNOSIS — B181 Chronic viral hepatitis B without delta-agent: Secondary | ICD-10-CM | POA: Diagnosis not present

## 2020-03-18 NOTE — Assessment & Plan Note (Signed)
His hepatitis B infection remains under good control without treatment.  There is no indication to start therapy now.  He will follow-up after blood work in 6 months.  I did encourage him to get his Covid booster soon.

## 2020-03-18 NOTE — Progress Notes (Signed)
         [facility] for Infectious Disease  Patient Active Problem List   Diagnosis Date Noted  . Chronic viral hepatitis B without delta-agent ([place]) 02/21/2017    Priority: High  . Thrombocytopenia ([place]) 07/23/2018    Patient's Medications  New Prescriptions   No medications on file  Previous Medications   OMEPRAZOLE (PRILOSEC) 20 MG CAPSULE    Take 1 capsule (20 mg total) by mouth daily.  Modified Medications   No medications on file  Discontinued Medications   IBUPROFEN (ADVIL) 600 MG TABLET    Take 1 tablet (600 mg total) by mouth every 8 (eight) hours as needed.    Subjective: Caleb Chandler is in for his routine follow-up visit.  Caleb Chandler is feeling well.  Caleb Chandler has received both doses of his Phizer Covid vaccine and wants to know if Caleb Chandler should receive a booster dose.  Caleb Chandler is 2 and his first child, a daughter, soon.  Caleb Chandler has received his influenza vaccine.  Review of Systems: Review of Systems  Constitutional: Negative for chills, diaphoresis, fever and weight loss.  Gastrointestinal: Negative for abdominal pain, diarrhea, nausea and vomiting.  Skin: Negative for rash.    Past Medical History:  Diagnosis Date  . Chronic viral hepatitis B without delta-agent ([place])   . Mass of hand, right 03/2017    Social History   Tobacco Use  . Smoking status: Former Research scientist (life sciences)  . Smokeless tobacco: Never Used  . Tobacco comment: uses hookah rarely  Vaping Use  . Vaping Use: Never used  Substance Use Topics  . Alcohol use: No  . Drug use: No    Family History  Family history unknown: Yes    No Known Allergies  Objective: Vitals:   03/18/20 0905  BP: 126/85  Pulse: 66  Temp: 97.9 F (36.6 C)  TempSrc: Oral  SpO2: 99%  Weight: 188 lb (85.3 kg)  Height: 5\' 6"  (1.676 m)   Body mass index is 30.34 kg/m.  Physical Exam Constitutional:      Comments: Caleb Chandler is in good spirits.  Abdominal:     General: There is no distension.     Palpations: Abdomen is soft. There is no mass.      Tenderness: There is no abdominal tenderness.  Skin:    Findings: No rash.  Neurological:     Mental Status: Caleb Chandler is alert.  Psychiatric:        Mood and Affect: Mood normal.     Lab Results 03/02/2028  Liver enzymes normal Hepatitis B e antigen negative Hepatitis B DNA viral load 1010 IU/ml    Problem List Items Addressed This Visit      High   Chronic viral hepatitis B without delta-agent ([place])    His hepatitis B infection remains under good control without treatment.  There is no indication to start therapy now.  Caleb Chandler will follow-up after blood work in 6 months.  I did encourage him to get his Covid booster soon.      Relevant Orders   CBC   Comprehensive metabolic panel   Hepatitis B DNA, ultraquantitative, PCR   Hepatitis B e antibody   Hepatitis B e antigen       Caleb Bickers, MD [facility] for Infectious [place] [phone] pager   [phone] cell 03/18/2020, 9:25 AM

## 2020-04-27 ENCOUNTER — Ambulatory Visit: Payer: Self-pay | Admitting: Cardiology

## 2020-06-04 ENCOUNTER — Ambulatory Visit: Payer: Self-pay | Admitting: Cardiology

## 2020-07-31 NOTE — Progress Notes (Signed)
Patient referred by Kerin Perna, NP for chest pain  Subjective:   Caleb Chandler, male    DOB: 06-24-1980, 40 y.o.   MRN: [id number]   Chief Complaint  Patient presents with  . Chest Pain  . New Patient (Initial Visit)     HPI  40 y.o. African American male with chronic hepB, referred for evaluation of chest pain  Patient is originally from [place], has been in [place] since 2018.  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  He also works a job at night, Occupational hygienist.  He does not have any family h/o CAD. He used to smoke [place] while he lived in [place]. He has noticed episodes of focal left sided chest and shoulder pain. Pain usually happens at rest, lasts for 3-5 min, and resolved with certain movement of his arm. Episodes never occur with exertion. He used to play soccer in the past, has not played for a while. However he stays active with activities related to his work, which include lifting and moving small boxes, walking etc.     Past Medical History:  Diagnosis Date  . Chronic viral hepatitis B without delta-agent ([place])   . Mass of hand, right 03/2017     Past Surgical History:  Procedure Laterality Date  . EXCISION METACARPAL MASS Right 04/03/2017   Procedure: RIGHT HAND MASS EXCISION;  Surgeon: Milly Jakob, MD;  Location: [facility];  Service: Orthopedics;  Laterality: Right;     Social History   Tobacco Use  Smoking Status Never Smoker  Smokeless Tobacco Never Used  Tobacco Comment   uses hookah rarely    Social History   Substance and Sexual Activity  Alcohol Use No   History reviewed. No pertinent family history.    No current outpatient medications on file prior to visit.   No current facility-administered medications on file prior to visit.    Cardiovascular and other pertinent studies:  EKG 08/03/2020:  Sinus rhythm 62  bpm Leftward axis  Cannot exclude old lateral infarct  EKG 01/28/2020:  Sinus  Rhythm  Left axis -anterior fascicular block.    Recent labs: 03/02/2020: Glucose 86, BUN/Cr 20/0.93. EGFR N/.A. Na/K 140/4. Rest of the CMP normal   Review of Systems  Cardiovascular: Positive for chest pain. Negative for dyspnea on exertion, leg swelling, palpitations and syncope.         Vitals:   08/03/20 0851  BP: 113/86  Pulse: 66  Resp: 16  Temp: 98 F (36.7 C)  SpO2: 97%     Body mass index is 30.51 kg/m. Filed Weights   08/03/20 0851  Weight: 189 lb (85.7 kg)     Objective:   Physical Exam Vitals and nursing note reviewed.  Constitutional:      General: He is not in acute distress. Neck:     Vascular: No JVD.  Cardiovascular:     Rate and Rhythm: Normal rate and regular rhythm.     Heart sounds: Normal heart sounds. No murmur heard.   Pulmonary:     Effort: Pulmonary effort is normal.     Breath sounds: Normal breath sounds. No wheezing or rales.         Assessment & Recommendations:   40 y.o. African American male with chronic hepB, referred for evaluation of chest pain  Chest pain: Most likely musculoskeletal.  No suspicion for CAD.  Recommend exercise treadmill stress  test. Consider using ibuprofen with musculoskeletal pain.  Further recommendations after above testing.  Thank you for referring the patient to [place]. Please feel free to contact with any questions.   Nigel Mormon, MD Pager: [phone] Office: [phone],

## 2020-08-02 DIAGNOSIS — R072 Precordial pain: Secondary | ICD-10-CM | POA: Insufficient documentation

## 2020-08-03 ENCOUNTER — Other Ambulatory Visit: Payer: Self-pay

## 2020-08-03 ENCOUNTER — Ambulatory Visit: Payer: 59 | Admitting: Cardiology

## 2020-08-03 ENCOUNTER — Ambulatory Visit: Payer: Self-pay | Admitting: Cardiology

## 2020-08-03 ENCOUNTER — Encounter: Payer: Self-pay | Admitting: Cardiology

## 2020-08-03 VITALS — BP 113/86 | HR 66 | Temp 98.0°F | Resp 16 | Ht 66.0 in | Wt 189.0 lb

## 2020-08-03 DIAGNOSIS — R072 Precordial pain: Secondary | ICD-10-CM

## 2020-09-11 ENCOUNTER — Ambulatory Visit: Payer: 59

## 2020-09-11 ENCOUNTER — Other Ambulatory Visit: Payer: Self-pay

## 2020-09-11 DIAGNOSIS — R072 Precordial pain: Secondary | ICD-10-CM

## 2020-09-15 ENCOUNTER — Other Ambulatory Visit: Payer: 59

## 2020-09-15 ENCOUNTER — Other Ambulatory Visit: Payer: Self-pay

## 2020-09-15 DIAGNOSIS — B181 Chronic viral hepatitis B without delta-agent: Secondary | ICD-10-CM

## 2020-09-18 LAB — COMPREHENSIVE METABOLIC PANEL
AG Ratio: 1.5 (calc) (ref 1.0–2.5)
ALT: 19 U/L (ref 9–46)
AST: 19 U/L (ref 10–40)
Albumin: 4.2 g/dL (ref 3.6–5.1)
Alkaline phosphatase (APISO): 43 U/L (ref 36–130)
BUN: 16 mg/dL (ref 7–25)
CO2: 26 mmol/L (ref 20–32)
Calcium: 9.3 mg/dL (ref 8.6–10.3)
Chloride: 106 mmol/L (ref 98–110)
Creat: 1 mg/dL (ref 0.60–1.35)
Globulin: 2.8 g/dL (calc) (ref 1.9–3.7)
Glucose, Bld: 150 mg/dL — ABNORMAL HIGH (ref 65–99)
Potassium: 3.8 mmol/L (ref 3.5–5.3)
Sodium: 139 mmol/L (ref 135–146)
Total Bilirubin: 0.6 mg/dL (ref 0.2–1.2)
Total Protein: 7 g/dL (ref 6.1–8.1)

## 2020-09-18 LAB — HEPATITIS B E ANTIBODY: Hep B E Ab: REACTIVE — AB

## 2020-09-18 LAB — CBC
HCT: 44.6 % (ref 38.5–50.0)
Hemoglobin: 15.1 g/dL (ref 13.2–17.1)
MCH: 30.1 pg (ref 27.0–33.0)
MCHC: 33.9 g/dL (ref 32.0–36.0)
MCV: 88.8 fL (ref 80.0–100.0)
MPV: 10.9 fL (ref 7.5–12.5)
Platelets: 137 10*3/uL — ABNORMAL LOW (ref 140–400)
RBC: 5.02 10*6/uL (ref 4.20–5.80)
RDW: 12.4 % (ref 11.0–15.0)
WBC: 4 10*3/uL (ref 3.8–10.8)

## 2020-09-18 LAB — HEPATITIS B DNA, ULTRAQUANTITATIVE, PCR
Hepatitis B DNA (Calc): 3.74 Log IU/mL — ABNORMAL HIGH
Hepatitis B DNA: 5510 IU/mL — ABNORMAL HIGH

## 2020-09-18 LAB — HEPATITIS B E ANTIGEN: Hep B E Ag: NONREACTIVE

## 2020-09-22 ENCOUNTER — Telehealth (INDEPENDENT_AMBULATORY_CARE_PROVIDER_SITE_OTHER): Payer: Self-pay

## 2020-09-22 NOTE — Telephone Encounter (Signed)
Please contact patient and advise on what he can take if he can take anything. [name], CMA    Copied from [place] [id number]. Topic: General - Other >> Sep 18, 2020 10:35 AM Keene Breath wrote: Reason for CRM: Patient would like the nurse to call him regarding his positive covid test.  He would like to know what medication he can take.  CB# [phone]

## 2020-09-30 ENCOUNTER — Encounter: Payer: Self-pay | Admitting: Internal Medicine

## 2020-09-30 ENCOUNTER — Other Ambulatory Visit: Payer: Self-pay

## 2020-09-30 ENCOUNTER — Ambulatory Visit (INDEPENDENT_AMBULATORY_CARE_PROVIDER_SITE_OTHER): Payer: 59 | Admitting: Internal Medicine

## 2020-09-30 DIAGNOSIS — Z7184 Encounter for health counseling related to travel: Secondary | ICD-10-CM

## 2020-09-30 DIAGNOSIS — B181 Chronic viral hepatitis B without delta-agent: Secondary | ICD-10-CM

## 2020-09-30 MED ORDER — ATOVAQUONE-PROGUANIL HCL 250-100 MG PO TABS: ORAL_TABLET | ORAL | 0 refills | Status: DC

## 2020-09-30 NOTE — Assessment & Plan Note (Signed)
Will prescribe Malarone for malaria prophylaxis.

## 2020-09-30 NOTE — Assessment & Plan Note (Signed)
His hepatitis B viral load is up slightly but otherwise there is no strong indication to start antiviral therapy at this time.  His fibrosis score was F0 1 year ago.  I will repeat that today and see him back after a full set of labs in 6 months.

## 2020-09-30 NOTE — Progress Notes (Signed)
[facility] for Infectious Disease  Patient Active Problem List   Diagnosis Date Noted  . Chronic viral hepatitis B without delta-agent ([place]) 02/21/2017    Priority: High  . Travel advice encounter 09/30/2020  . Precordial pain 08/02/2020  . Thrombocytopenia ([place]) 07/23/2018    Patient's Medications  New Prescriptions   ATOVAQUONE-PROGUANIL (MALARONE) 250-100 MG TABS TABLET    Take 1 tablet by mouth daily starting 1 week before you travel.  Previous Medications   No medications on file  Modified Medications   No medications on file  Discontinued Medications   No medications on file    Subjective: Caleb Chandler is in for his routine follow-up visit.  Caleb Chandler is feeling well.  Caleb Chandler will be traveling back to [place] on 10/25/2020 to see his wife and his 44-year-old daughter that Caleb Chandler is never met.  Caleb Chandler will be there for several weeks.   Review of Systems: Review of Systems  Constitutional: Negative for chills, diaphoresis, fever and weight loss.  Gastrointestinal: Negative for abdominal pain, diarrhea, nausea and vomiting.  Skin: Negative for rash.    Past Medical History:  Diagnosis Date  . Chronic viral hepatitis B without delta-agent ([place])   . Mass of hand, right 03/2017    Social History   Tobacco Use  . Smoking status: Never Smoker  . Smokeless tobacco: Never Used  . Tobacco comment: uses hookah rarely  Vaping Use  . Vaping Use: Former  Substance Use Topics  . Alcohol use: No  . Drug use: No    No family history on file.  No Known Allergies  Objective: Vitals:   09/30/20 0904  BP: 131/86  Pulse: 66  Resp: 16  SpO2: 99%  Weight: 182 lb (82.6 kg)  Height: _0  (1.676 m)   Body mass index is 29.38 kg/m.  Physical Exam Constitutional:      Comments: Caleb Chandler is in good spirits.  Abdominal:     General: There is no distension.     Palpations: Abdomen is soft. There is no mass.     Tenderness: There is no abdominal tenderness.  Skin:    Findings: No  rash.  Neurological:     Mental Status: Caleb Chandler is alert.  Psychiatric:        Mood and Affect: Mood normal.     Lab Results 09/15/2020 Liver enzymes normal Hepatitis B e antigen negative Hepatitis B DNA viral load 5510 IU/ml    Problem List Items Addressed This Visit      High   Chronic viral hepatitis B without delta-agent ([place])    His hepatitis B viral load is up slightly but otherwise there is no strong indication to start antiviral therapy at this time.  His fibrosis score was F0 1 year ago.  I will repeat that today and see him back after a full set of labs in 6 months.      Relevant Orders   Liver Fibrosis, FibroTest-ActiTest   Hepatitis B DNA, ultraquantitative, PCR   Hepatitis B e antibody   Hepatitis B e antigen   CBC   Comprehensive metabolic panel     Unprioritized   Travel advice encounter    Will prescribe Malarone for malaria prophylaxis.      Relevant Medications   atovaquone-proguanil (MALARONE) 250-100 MG TABS tablet       Michel Bickers, MD [facility] for Infectious [place] [phone] pager   [phone] cell 09/30/2020, 9:51 AM

## 2020-10-08 LAB — LIVER FIBROSIS, FIBROTEST-ACTITEST
ALT: 16 U/L (ref 9–46)
Alpha-2-Macroglobulin: 192 mg/dL (ref 106–279)
Apolipoprotein A1: 108 mg/dL (ref 94–176)
Bilirubin: 0.4 mg/dL (ref 0.2–1.2)
Fibrosis Score: 0.16
GGT: 16 U/L (ref 3–90)
Haptoglobin: 134 mg/dL (ref 43–212)
Necroinflammat ACT Score: 0.05
Reference ID: 3879863

## 2020-12-17 ENCOUNTER — Encounter (HOSPITAL_COMMUNITY): Payer: Self-pay

## 2020-12-17 ENCOUNTER — Other Ambulatory Visit: Payer: Self-pay

## 2020-12-17 ENCOUNTER — Ambulatory Visit (HOSPITAL_COMMUNITY)
Admission: EM | Admit: 2020-12-17 | Discharge: 2020-12-17 | Disposition: A | Discharge status: Home / Self Care | Payer: 59 | Attending: Family Medicine | Admitting: Family Medicine

## 2020-12-17 DIAGNOSIS — L02416 Cutaneous abscess of left lower limb: Secondary | ICD-10-CM

## 2020-12-17 DIAGNOSIS — M79652 Pain in left thigh: Secondary | ICD-10-CM | POA: Diagnosis not present

## 2020-12-17 MED ORDER — LIDOCAINE HCL (PF) 2 % IJ SOLN
INTRAMUSCULAR | Status: AC
  Filled 2020-12-17: qty 5

## 2020-12-17 MED ORDER — DOXYCYCLINE HYCLATE 100 MG PO CAPS: 100.0000 mg | ORAL_CAPSULE | Freq: Two times a day (BID) | ORAL | 0 refills | Status: DC

## 2020-12-17 NOTE — ED Triage Notes (Signed)
Pt in with c/o swelling to posterior left upper thigh that he noticed a few days ago  Pt states the area drained some fluid and it is very painful

## 2020-12-17 NOTE — ED Provider Notes (Signed)
  [facility]   [id number] 12/17/20 Arrival Time: [place]:  1. Abscess of left thigh     Incision and Drainage Procedure Note  Anesthesia: 2% plain lidocaine  Procedure Details  The procedure, risks and complications have been discussed in detail (including, but not limited to pain and bleeding) with the patient.  The skin induration was prepped and draped in the usual fashion. After adequate local anesthesia, I&D with a #11 blade was performed on the left posterior thigh with copious drainage.  EBL: minimal Drains: none Packing: none Condition: Tolerated procedure well Complications: none.  Meds ordered this encounter  Medications   doxycycline (VIBRAMYCIN) 100 MG capsule    Sig: Take 1 capsule (100 mg total) by mouth 2 (two) times daily.    Dispense:  14 capsule    Refill:  0    Wound care instructions discussed and given in written format.  Finish all antibiotics. OTC analgesics as needed.  Reviewed expectations re: course of current medical issues. Questions answered. Outlined signs and symptoms indicating need for more acute intervention. Patient verbalized understanding. After Visit Summary given.   SUBJECTIVE:  Caleb Chandler is a 40 y.o. male who presents with a possible infection of his L thigh. Onset gradual, approximately a few days ago without active drainage and without active bleeding. Very painful now. Symptoms have gradually worsened since beginning. Fever: absent. No tx PTA.   OBJECTIVE:  Vitals:   12/17/20 1545  BP: 128/87  Pulse: 96  Resp: 18  Temp: 100.1 F (37.8 C)  TempSrc: Oral  SpO2: 97%     General appearance: alert; no distress Skin: approx 2x3 area of skin thickening over posterior L thigh; tender to touch; no active drainage or bleeding Psychological: alert and cooperative; normal mood and affect  No Known Allergies  Past Medical History:  Diagnosis Date   Chronic viral hepatitis B without  delta-agent ([facility])    Mass of hand, right 03/2017   Social History   Socioeconomic History   Marital status: Married    Spouse name: Not on file   Number of children: 1   Years of education: Not on file   Highest education level: Not on file  Occupational History   Not on file  Tobacco Use   Smoking status: Never   Smokeless tobacco: Never   Tobacco comments:    uses hookah rarely  Vaping Use   Vaping Use: Former  Substance and Sexual Activity   Alcohol use: No   Drug use: No   Sexual activity: Not Currently    Comment: none x 6 months  Other Topics Concern   Not on file  Social History Narrative   Not on file   Social Determinants of Health   Financial Resource Strain: Not on file  Food Insecurity: Not on file  Transportation Needs: Not on file  Physical Activity: Not on file  Stress: Not on file  Social Connections: Not on file   History reviewed. No pertinent family history. Past Surgical History:  Procedure Laterality Date   EXCISION METACARPAL MASS Right 04/03/2017   Procedure: RIGHT HAND MASS EXCISION;  Surgeon: Milly Jakob, MD;  Location: [facility];  Service: Orthopedics;  Laterality: Right;            Vanessa Kick, MD 12/17/20 [phone]

## 2021-01-21 ENCOUNTER — Encounter (HOSPITAL_COMMUNITY): Payer: Self-pay | Admitting: Emergency Medicine

## 2021-01-21 ENCOUNTER — Ambulatory Visit (HOSPITAL_COMMUNITY)
Admission: EM | Admit: 2021-01-21 | Discharge: 2021-01-21 | Disposition: A | Discharge status: Home / Self Care | Payer: 59 | Attending: Physician Assistant | Admitting: Physician Assistant

## 2021-01-21 ENCOUNTER — Other Ambulatory Visit: Payer: Self-pay

## 2021-01-21 DIAGNOSIS — L089 Local infection of the skin and subcutaneous tissue, unspecified: Secondary | ICD-10-CM | POA: Diagnosis not present

## 2021-01-21 MED ORDER — DOXYCYCLINE HYCLATE 100 MG PO CAPS: 100.0000 mg | ORAL_CAPSULE | Freq: Two times a day (BID) | ORAL | 0 refills | Status: DC

## 2021-01-21 NOTE — Discharge Instructions (Signed)
Return if any problems.

## 2021-01-21 NOTE — ED Triage Notes (Signed)
Pt c/o right sided posterior neck pain for 3 days. Denies injury

## 2021-01-21 NOTE — ED Provider Notes (Signed)
[facility]    CSN: [id number] Arrival date & time: 01/21/21  1143      History   Chief Complaint Chief Complaint  Patient presents with   Neck Pain    HPI Caleb Chandler is a 40 y.o. male.   Pt complains of  swelling and discomfort to the back of his scalp.  Pt reports area has been draining.  Pt has had similar in the past.  Pt complains of a headache and swollen area to the back of his neck.  The history is provided by the patient. A language interpreter was used.   Past Medical History:  Diagnosis Date   Chronic viral hepatitis B without delta-agent ([place])    Mass of hand, right 03/2017    Patient Active Problem List   Diagnosis Date Noted   Travel advice encounter 09/30/2020   Precordial pain 08/02/2020   Thrombocytopenia ([place]) 07/23/2018   Chronic viral hepatitis B without delta-agent ([place]) 02/21/2017    Past Surgical History:  Procedure Laterality Date   EXCISION METACARPAL MASS Right 04/03/2017   Procedure: RIGHT HAND MASS EXCISION;  Surgeon: Milly Jakob, MD;  Location: [facility];  Service: Orthopedics;  Laterality: Right;       Home Medications    Prior to Admission medications   Medication Sig Start Date End Date Taking? Authorizing Provider  doxycycline (VIBRAMYCIN) 100 MG capsule Take 1 capsule (100 mg total) by mouth 2 (two) times daily. 01/21/21  Yes Caryl Ada K, PA-C  atovaquone-proguanil (MALARONE) 250-100 MG TABS tablet Take 1 tablet by mouth daily starting 1 week before you travel. 09/30/20   Michel Bickers, MD    Family History No family history on file.  Social History Social History   Tobacco Use   Smoking status: Never   Smokeless tobacco: Never   Tobacco comments:    uses hookah rarely  Vaping Use   Vaping Use: Former  Substance Use Topics   Alcohol use: No   Drug use: No     Allergies   Patient has no known allergies.   Review of Systems Review of Systems  Musculoskeletal:  Positive  for neck pain.  All other systems reviewed and are negative.   Physical Exam Triage Vital Signs ED Triage Vitals  Enc Vitals Group     BP 01/21/21 1158 135/90     Pulse Rate 01/21/21 1158 89     Resp 01/21/21 1158 17     Temp 01/21/21 1158 99.3 F (37.4 C)     Temp Source 01/21/21 1158 Oral     SpO2 01/21/21 1158 98 %     Weight --      Height --      Head Circumference --      Peak Flow --      Pain Score 01/21/21 1157 7     Pain Loc --      Pain Edu? --      Excl. in [place]? --    No data found.  Updated Vital Signs BP 135/90 (BP Location: Left Arm)   Pulse 89   Temp 99.3 F (37.4 C) (Oral)   Resp 17   SpO2 98%   Visual Acuity Right Eye Distance:   Left Eye Distance:   Bilateral Distance:    Right Eye Near:   Left Eye Near:    Bilateral Near:     Physical Exam Vitals reviewed.  Neck:     Comments: Swollen area left occipital neck  Cardiovascular:     Rate and Rhythm: Normal rate.  Pulmonary:     Effort: Pulmonary effort is normal.  Musculoskeletal:     Cervical back: Tenderness present.  Lymphadenopathy:     Cervical: Cervical adenopathy present.  Skin:    Findings: Erythema present.     Comments: Swollen occipital scalp, oozing area    Neurological:     General: No focal deficit present.     Mental Status: He is alert.  Psychiatric:        Mood and Affect: Mood normal.     UC Treatments / Results  Labs (all labs ordered are listed, but only abnormal results are displayed) Labs Reviewed - No data to display  EKG   Radiology No results found.  Procedures Procedures (including critical care time)  Medications Ordered in UC Medications - No data to display  Initial Impression / Assessment and Plan / UC Course  I have reviewed the triage vital signs and the nursing notes.  Pertinent labs & imaging results that were available during my care of the patient were reviewed by me and considered in my medical decision making (see chart for  details).     MDM:  Pt counseled on infection,  swollen lymph nodes and headache  Pt advised to follow up with dermatology Final Clinical Impressions(s) / UC Diagnoses   Final diagnoses:  Infection of scalp     Discharge Instructions      Return if any problems.     ED Prescriptions     Medication Sig Dispense Auth. Provider   doxycycline (VIBRAMYCIN) 100 MG capsule Take 1 capsule (100 mg total) by mouth 2 (two) times daily. 20 capsule Fransico Meadow, [place]      PDMP not reviewed this encounter.   Fransico Meadow, [place] 01/21/21 1253

## 2021-03-17 ENCOUNTER — Other Ambulatory Visit: Payer: 59

## 2021-03-17 ENCOUNTER — Other Ambulatory Visit: Payer: Self-pay

## 2021-03-17 DIAGNOSIS — B181 Chronic viral hepatitis B without delta-agent: Secondary | ICD-10-CM

## 2021-03-20 LAB — CBC
HCT: 48.6 % (ref 38.5–50.0)
Hemoglobin: 16.7 g/dL (ref 13.2–17.1)
MCH: 30.8 pg (ref 27.0–33.0)
MCHC: 34.4 g/dL (ref 32.0–36.0)
MCV: 89.5 fL (ref 80.0–100.0)
MPV: 11.3 fL (ref 7.5–12.5)
Platelets: 155 10*3/uL (ref 140–400)
RBC: 5.43 10*6/uL (ref 4.20–5.80)
RDW: 13.3 % (ref 11.0–15.0)
WBC: 4.6 10*3/uL (ref 3.8–10.8)

## 2021-03-20 LAB — COMPREHENSIVE METABOLIC PANEL
AG Ratio: 1.3 (calc) (ref 1.0–2.5)
ALT: 26 U/L (ref 9–46)
AST: 23 U/L (ref 10–40)
Albumin: 4.7 g/dL (ref 3.6–5.1)
Alkaline phosphatase (APISO): 42 U/L (ref 36–130)
BUN: 18 mg/dL (ref 7–25)
CO2: 28 mmol/L (ref 20–32)
Calcium: 9.9 mg/dL (ref 8.6–10.3)
Chloride: 101 mmol/L (ref 98–110)
Creat: 0.89 mg/dL (ref 0.60–1.26)
Globulin: 3.5 g/dL (calc) (ref 1.9–3.7)
Glucose, Bld: 80 mg/dL (ref 65–99)
Potassium: 4.2 mmol/L (ref 3.5–5.3)
Sodium: 138 mmol/L (ref 135–146)
Total Bilirubin: 0.5 mg/dL (ref 0.2–1.2)
Total Protein: 8.2 g/dL — ABNORMAL HIGH (ref 6.1–8.1)

## 2021-03-20 LAB — HEPATITIS B DNA, ULTRAQUANTITATIVE, PCR
Hepatitis B DNA (Calc): 3.84 Log IU/mL — ABNORMAL HIGH
Hepatitis B DNA: 6900 IU/mL — ABNORMAL HIGH

## 2021-03-20 LAB — HEPATITIS B E ANTIBODY: Hep B E Ab: REACTIVE — AB

## 2021-03-20 LAB — HEPATITIS B E ANTIGEN: Hep B E Ag: NONREACTIVE

## 2021-04-07 ENCOUNTER — Encounter: Payer: Self-pay | Admitting: Internal Medicine

## 2021-04-07 ENCOUNTER — Other Ambulatory Visit: Payer: Self-pay

## 2021-04-07 ENCOUNTER — Ambulatory Visit (INDEPENDENT_AMBULATORY_CARE_PROVIDER_SITE_OTHER): Payer: 59 | Admitting: Internal Medicine

## 2021-04-07 DIAGNOSIS — B181 Chronic viral hepatitis B without delta-agent: Secondary | ICD-10-CM | POA: Diagnosis not present

## 2021-04-07 NOTE — Assessment & Plan Note (Signed)
He has chronic hepatitis B e antigen negative hepatitis but no evidence of liver inflammation and relatively low and stable viral loads.  I do not feel there is any indication to start therapy at this time.  He will follow-up after lab work in 6 months.

## 2021-04-07 NOTE — Progress Notes (Signed)
[facility] for [facility]ious [facility]  Patient Active Problem List   Diagnosis Date Noted   Chronic viral hepatitis B without delta-agent ([place]) 02/21/2017    Priority: High   Travel advice encounter 09/30/2020   Precordial pain 08/02/2020   Thrombocytopenia ([place]) 07/23/2018    Patient's Medications  New Prescriptions   No medications on file  Previous Medications   ATOVAQUONE-PROGUANIL (MALARONE) 250-100 MG TABS TABLET    Take 1 tablet by mouth daily starting 1 week before you travel.   DOXYCYCLINE (VIBRAMYCIN) 100 MG CAPSULE    Take 1 capsule (100 mg total) by mouth 2 (two) times daily.  Modified Medications   No medications on file  Discontinued Medications   No medications on file    Subjective: Caleb Chandler is in for his routine follow-up visit.  He is feeling well.  He will be travelled back to [place] in June to see his wife and his 39-year-old daughter that he had never met before.  He was there for 6 weeks.  He said the experience was "amazing."  Review of Systems: Review of Systems  Constitutional:  Negative for chills, diaphoresis, fever and weight loss.  Gastrointestinal:  Negative for abdominal pain, diarrhea, nausea and vomiting.  Skin:  Negative for rash.   Past Medical History:  Diagnosis Date   Chronic viral hepatitis B without delta-agent ([place])    Mass of hand, right 03/2017    Social History   Tobacco Use   Smoking status: Never   Smokeless tobacco: Never   Tobacco comments:    uses hookah rarely  Vaping Use   Vaping Use: Former  Substance Use Topics   Alcohol use: No   Drug use: No    No family history on file.  No Known Allergies  Objective: Vitals:   04/07/21 0902  BP: 120/77  Pulse: 80  Temp: 97.9 F (36.6 C)  TempSrc: Temporal  Weight: 191 lb (86.6 kg)   Body mass index is 30.83 kg/m.  Physical Exam Constitutional:      Comments: He is in good spirits.  Abdominal:     General: There is no distension.      Palpations: Abdomen is soft. There is no mass.     Tenderness: There is no abdominal tenderness.  Skin:    Findings: No rash.  Neurological:     Mental Status: He is alert.  Psychiatric:        Mood and Affect: Mood normal.    Lab Results 03/17/2021 Liver enzymes normal Hepatitis B e antigen negative Hepatitis B DNA viral load 6900 IU/ml  Fibrotest 09/30/2020 F0   Problem List Items Addressed This Visit       High   Chronic viral hepatitis B without delta-agent ([place])    He has chronic hepatitis B e antigen negative hepatitis but no evidence of liver inflammation and relatively low and stable viral loads.  I do not feel there is any indication to start therapy at this time.  He will follow-up after lab work in 6 months.      Relevant Orders   Hepatitis B DNA, ultraquantitative, PCR   Hepatitis B e antibody   Hepatitis B e antigen   Comprehensive metabolic panel     Michel Bickers, MD [facility] for [place] [phone] pager   [phone] cell 04/07/2021, 9:22 AM

## 2021-09-15 ENCOUNTER — Other Ambulatory Visit: Payer: 59

## 2021-09-15 ENCOUNTER — Other Ambulatory Visit: Payer: Self-pay

## 2021-09-15 DIAGNOSIS — B181 Chronic viral hepatitis B without delta-agent: Secondary | ICD-10-CM

## 2021-09-19 LAB — COMPREHENSIVE METABOLIC PANEL
AG Ratio: 1.5 (calc) (ref 1.0–2.5)
ALT: 18 U/L (ref 9–46)
AST: 20 U/L (ref 10–40)
Albumin: 4.4 g/dL (ref 3.6–5.1)
Alkaline phosphatase (APISO): 43 U/L (ref 36–130)
BUN: 14 mg/dL (ref 7–25)
CO2: 27 mmol/L (ref 20–32)
Calcium: 9.4 mg/dL (ref 8.6–10.3)
Chloride: 102 mmol/L (ref 98–110)
Creat: 0.92 mg/dL (ref 0.60–1.26)
Globulin: 3 g/dL (calc) (ref 1.9–3.7)
Glucose, Bld: 111 mg/dL — ABNORMAL HIGH (ref 65–99)
Potassium: 3.8 mmol/L (ref 3.5–5.3)
Sodium: 138 mmol/L (ref 135–146)
Total Bilirubin: 0.7 mg/dL (ref 0.2–1.2)
Total Protein: 7.4 g/dL (ref 6.1–8.1)

## 2021-09-19 LAB — HEPATITIS B DNA, ULTRAQUANTITATIVE, PCR
Hepatitis B DNA (Calc): 2.92 Log IU/mL — ABNORMAL HIGH
Hepatitis B DNA: 830 IU/mL — ABNORMAL HIGH

## 2021-09-19 LAB — HEPATITIS B E ANTIGEN: Hep B E Ag: NONREACTIVE

## 2021-09-19 LAB — HEPATITIS B E ANTIBODY: Hep B E Ab: REACTIVE — AB

## 2021-09-29 ENCOUNTER — Encounter: Payer: 59 | Admitting: Internal Medicine

## 2021-10-06 ENCOUNTER — Other Ambulatory Visit: Payer: Self-pay

## 2021-10-06 ENCOUNTER — Ambulatory Visit (INDEPENDENT_AMBULATORY_CARE_PROVIDER_SITE_OTHER): Payer: 59 | Admitting: Internal Medicine

## 2021-10-06 DIAGNOSIS — B181 Chronic viral hepatitis B without delta-agent: Secondary | ICD-10-CM

## 2021-10-06 NOTE — Assessment & Plan Note (Signed)
There has been no significant progression of his chronic hepatitis B.  His liver enzymes remain normal and his fibrosis score last year showed no evidence of fibrosis.  He will remain off of antiviral treatment and follow-up in 6 months.

## 2021-10-06 NOTE — Progress Notes (Signed)
         [facility] for Infectious Disease  Patient Active Problem List   Diagnosis Date Noted   Chronic viral hepatitis B without delta-agent ([place]) 02/21/2017    Priority: High   Travel advice encounter 09/30/2020   Precordial pain 08/02/2020   Thrombocytopenia ([place]) 07/23/2018    Patient's Medications  New Prescriptions   No medications on file  Previous Medications   ATOVAQUONE-PROGUANIL (MALARONE) 250-100 MG TABS TABLET    Take 1 tablet by mouth daily starting 1 week before you travel.   DOXYCYCLINE (VIBRAMYCIN) 100 MG CAPSULE    Take 1 capsule (100 mg total) by mouth 2 (two) times daily.  Modified Medications   No medications on file  Discontinued Medications   No medications on file    Subjective: Caleb Chandler is in for his routine follow-up visit.  He is feeling well.  He currently has 2 jobs and is working 7 days a week.  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  Review of Systems: Review of Systems  Constitutional:  Negative for chills, diaphoresis, fever and weight loss.  Gastrointestinal:  Negative for abdominal pain, diarrhea, nausea and vomiting.  Skin:  Negative for rash.   Past Medical History:  Diagnosis Date   Chronic viral hepatitis B without delta-agent ([place])    Mass of hand, right 03/2017    Social History   Tobacco Use   Smoking status: Never   Smokeless tobacco: Never   Tobacco comments:    uses hookah rarely  Vaping Use   Vaping Use: Former  Substance Use Topics   Alcohol use: No   Drug use: No    No family history on file.  No Known Allergies  Objective: Vitals:   10/06/21 0846  Weight: 193 lb 9.6 oz (87.8 kg)   Body mass index is 31.25 kg/m.  Physical Exam Constitutional:      Comments: He is in good spirits.  Abdominal:     General: There is no distension.     Palpations: Abdomen is soft. There is no mass.     Tenderness: There is no abdominal tenderness.   Skin:    Findings: No rash.  Neurological:     Mental Status: He is alert.  Psychiatric:        Mood and Affect: Mood normal.    Lab Results 09/15/2021 Liver enzymes normal Hepatitis B e antigen negative Hepatitis B DNA viral load 830 IU/ml  Fibrotest 09/30/2020 F0   Problem List Items Addressed This Visit       High   Chronic viral hepatitis B without delta-agent ([place])    There has been no significant progression of his chronic hepatitis B.  His liver enzymes remain normal and his fibrosis score last year showed no evidence of fibrosis.  He will remain off of antiviral treatment and follow-up in 6 months.       Relevant Orders   Hepatitis B DNA, ultraquantitative, PCR   Hepatitis B e antibody   Hepatitis B e antigen   Comprehensive metabolic panel     Michel Bickers, MD [facility] for Infectious [place] [phone] pager   [phone] cell 10/06/2021, 8:59 AM

## 2022-03-15 ENCOUNTER — Other Ambulatory Visit: Payer: 59

## 2022-03-15 ENCOUNTER — Other Ambulatory Visit: Payer: Self-pay

## 2022-03-15 DIAGNOSIS — B181 Chronic viral hepatitis B without delta-agent: Secondary | ICD-10-CM

## 2022-03-17 LAB — COMPREHENSIVE METABOLIC PANEL
AG Ratio: 1.4 (calc) (ref 1.0–2.5)
ALT: 30 U/L (ref 9–46)
AST: 26 U/L (ref 10–40)
Albumin: 4.5 g/dL (ref 3.6–5.1)
Alkaline phosphatase (APISO): 43 U/L (ref 36–130)
BUN: 19 mg/dL (ref 7–25)
CO2: 29 mmol/L (ref 20–32)
Calcium: 9.5 mg/dL (ref 8.6–10.3)
Chloride: 107 mmol/L (ref 98–110)
Creat: 1.03 mg/dL (ref 0.60–1.29)
Globulin: 3.2 g/dL (calc) (ref 1.9–3.7)
Glucose, Bld: 100 mg/dL — ABNORMAL HIGH (ref 65–99)
Potassium: 4.1 mmol/L (ref 3.5–5.3)
Sodium: 140 mmol/L (ref 135–146)
Total Bilirubin: 0.5 mg/dL (ref 0.2–1.2)
Total Protein: 7.7 g/dL (ref 6.1–8.1)

## 2022-03-17 LAB — HEPATITIS B E ANTIGEN: Hep B E Ag: NONREACTIVE

## 2022-03-17 LAB — HEPATITIS B DNA, ULTRAQUANTITATIVE, PCR
Hepatitis B DNA: 460 IU/mL — ABNORMAL HIGH
Hepatitis B virus DNA: 2.66 Log IU/mL — ABNORMAL HIGH

## 2022-03-17 LAB — HEPATITIS B E ANTIBODY: Hep B E Ab: REACTIVE — AB

## 2022-03-30 ENCOUNTER — Ambulatory Visit (INDEPENDENT_AMBULATORY_CARE_PROVIDER_SITE_OTHER): Payer: 59 | Admitting: Internal Medicine

## 2022-03-30 ENCOUNTER — Other Ambulatory Visit: Payer: Self-pay

## 2022-03-30 ENCOUNTER — Encounter: Payer: Self-pay | Admitting: Internal Medicine

## 2022-03-30 DIAGNOSIS — B181 Chronic viral hepatitis B without delta-agent: Secondary | ICD-10-CM | POA: Diagnosis not present

## 2022-03-30 NOTE — Assessment & Plan Note (Signed)
His chronic hepatitis B remains stable without signs of active infection.  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  I do not feel that he needs treatment at this time.  I am comfortable with him following up after lab work in 1 year.

## 2022-03-30 NOTE — Progress Notes (Signed)
[facility] for Infectious Disease  Patient Active Problem List   Diagnosis Date Noted   Chronic viral hepatitis B without delta-agent ([place]) 02/21/2017    Priority: High   Travel advice encounter 09/30/2020   Precordial pain 08/02/2020   Thrombocytopenia ([place]) 07/23/2018    Patient's Medications  New Prescriptions   No medications on file  Previous Medications   ATOVAQUONE-PROGUANIL (MALARONE) 250-100 MG TABS TABLET    Take 1 tablet by mouth daily starting 1 week before you travel.   DOXYCYCLINE (VIBRAMYCIN) 100 MG CAPSULE    Take 1 capsule (100 mg total) by mouth 2 (two) times daily.  Modified Medications   No medications on file  Discontinued Medications   No medications on file    Subjective: Caleb Chandler is in for his routine follow-up visit.  He is feeling well.  He continues to work 2 jobs and is working 7 days a week except for holidays.  He has some mild intermittent low back pain that has occurred periodically ever since he fell out of a tree when he was 41 years old.  He rates it as 4 out of 10 at most and says that he does not need to take anything for it.  Review of Systems: Review of Systems  Constitutional:  Negative for chills, diaphoresis, fever and weight loss.  Gastrointestinal:  Negative for abdominal pain, diarrhea, nausea and vomiting.  Musculoskeletal:  Positive for back pain.  Skin:  Negative for rash.    Past Medical History:  Diagnosis Date   Chronic viral hepatitis B without delta-agent ([place])    Mass of hand, right 03/2017    Social History   Tobacco Use   Smoking status: Never   Smokeless tobacco: Never   Tobacco comments:    uses hookah rarely  Vaping Use   Vaping Use: Former  Substance Use Topics   Alcohol use: No   Drug use: No    No family history on file.  No Known Allergies  Objective: Vitals:   03/30/22 0909  BP: 120/78  Pulse: 78  Resp: 16  SpO2: 96%  Weight: 198 lb (89.8 kg)  Height: '5\' 6"'$  (1.676 m)    Body mass index is 31.96 kg/m.  Physical Exam Constitutional:      Comments: He is in good spirits.  Abdominal:     General: There is no distension.     Palpations: Abdomen is soft. There is no mass.     Tenderness: There is no abdominal tenderness.  Skin:    Findings: No rash.  Neurological:     Mental Status: He is alert.  Psychiatric:        Mood and Affect: Mood normal.     Lab Results 03/15/2022 Liver enzymes normal Hepatitis B e antigen negative Hepatitis B DNA viral load 460 IU/ml  Fibrotest 09/30/2020 F0   Problem List Items Addressed This Visit       High   Chronic viral hepatitis B without delta-agent ([place])    His chronic hepatitis B remains stable without signs of active infection.  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  I do not feel that he needs treatment at this time.  I am comfortable with him following up after lab work in 1 year.      Relevant Orders   Comprehensive metabolic panel   Hepatitis B DNA, ultraquantitative, PCR   Hepatitis B e antibody   Hepatitis B e antigen  Liver Fibrosis, FibroTest-ActiTest    Michel Bickers, MD [facility] for [place] [phone] pager   [phone] cell 03/30/2022, 9:26 AM

## 2022-07-13 ENCOUNTER — Ambulatory Visit: Payer: 59 | Admitting: Physician Assistant

## 2022-07-13 ENCOUNTER — Encounter: Payer: Self-pay | Admitting: Physician Assistant

## 2022-07-13 VITALS — BP 110/81 | HR 72 | Ht 66.54 in | Wt 198.0 lb

## 2022-07-13 DIAGNOSIS — Z0000 Encounter for general adult medical examination without abnormal findings: Secondary | ICD-10-CM

## 2022-07-13 DIAGNOSIS — Z6831 Body mass index (BMI) 31.0-31.9, adult: Secondary | ICD-10-CM

## 2022-07-13 DIAGNOSIS — E6609 Other obesity due to excess calories: Secondary | ICD-10-CM

## 2022-07-13 DIAGNOSIS — Z13220 Encounter for screening for lipoid disorders: Secondary | ICD-10-CM

## 2022-07-13 DIAGNOSIS — Z2989 Encounter for other specified prophylactic measures: Secondary | ICD-10-CM | POA: Diagnosis not present

## 2022-07-13 MED ORDER — ATOVAQUONE-PROGUANIL HCL 250-100 MG PO TABS: ORAL_TABLET | ORAL | 0 refills | Status: DC

## 2022-07-13 NOTE — Progress Notes (Signed)
Established Patient Office Visit  Subjective   Patient ID: Caleb Chandler, male    DOB: [birth year]  Age: 42 y.o. MRN: [id number]  Chief Complaint  Patient presents with   Annual Exam   Presents for annual exam without concern.  States that he continues to follow up with his liver specialist and is duet o see them again in the fall.  States that he will be traveling to [place] for 2 months to visit wife and child.  Request malaria prophylaxis  Sleep is good, appetite is good, mood is stable.  No other concerns at this time.    Past Medical History:  Diagnosis Date   Chronic viral hepatitis B without delta-agent ([place])    Mass of hand, right 03/2017   Social History   Socioeconomic History   Marital status: Married    Spouse name: Not on file   Number of children: 1   Years of education: Not on file   Highest education level: Not on file  Occupational History   Not on file  Tobacco Use   Smoking status: Never   Smokeless tobacco: Never   Tobacco comments:    uses hookah rarely  Vaping Use   Vaping Use: Former  Substance and Sexual Activity   Alcohol use: No   Drug use: No   Sexual activity: Not Currently    Comment: none x 6 months  Other Topics Concern   Not on file  Social History Narrative   Not on file   Social Determinants of Health   Financial Resource Strain: Not on file  Food Insecurity: Not on file  Transportation Needs: Not on file  Physical Activity: Not on file  Stress: Not on file  Social Connections: Not on file  Intimate Partner Violence: Not on file   History reviewed. No pertinent family history. No Known Allergies  Review of Systems  Constitutional: Negative.   HENT: Negative.    Eyes: Negative.   Respiratory:  Negative for shortness of breath.   Cardiovascular:  Negative for chest pain.  Gastrointestinal: Negative.   Genitourinary: Negative.   Musculoskeletal: Negative.   Skin: Negative.   Neurological: Negative.    Endo/Heme/Allergies: Negative.   Psychiatric/Behavioral: Negative.        Objective:     BP 110/81 (BP Location: Left Arm, Patient Position: Sitting, Cuff Size: Large)   Pulse 72   Ht 5' 6.54" (1.69 m)   Wt 198 lb (89.8 kg)   SpO2 98%   BMI 31.45 kg/m  BP Readings from Last 3 Encounters:  07/13/22 110/81  03/30/22 120/78  04/07/21 120/77   Wt Readings from Last 3 Encounters:  07/13/22 198 lb (89.8 kg)  03/30/22 198 lb (89.8 kg)  10/06/21 193 lb 9.6 oz (87.8 kg)      Physical Exam Vitals and nursing note reviewed.  Constitutional:      Appearance: Normal appearance.  HENT:     Head: Normocephalic and atraumatic.     Right Ear: External ear normal.     Left Ear: External ear normal.     Nose: Nose normal.     Mouth/Throat:     Mouth: Mucous membranes are moist.     Pharynx: Oropharynx is clear.  Eyes:     Extraocular Movements: Extraocular movements intact.     Conjunctiva/sclera: Conjunctivae normal.     Pupils: Pupils are equal, round, and reactive to light.  Cardiovascular:     Rate and Rhythm: Normal rate and regular rhythm.  Pulses: Normal pulses.     Heart sounds: Normal heart sounds.  Pulmonary:     Effort: Pulmonary effort is normal.     Breath sounds: Normal breath sounds.  Musculoskeletal:        General: Normal range of motion.     Cervical back: Normal range of motion and neck supple.  Skin:    General: Skin is warm and dry.  Neurological:     General: No focal deficit present.     Mental Status: He is alert and oriented to person, place, and time.  Psychiatric:        Mood and Affect: Mood normal.        Behavior: Behavior normal.        Thought Content: Thought content normal.        Judgment: Judgment normal.        Assessment & Plan:   Problem List Items Addressed This Visit   None Visit Diagnoses     Wellness examination    -  Primary   Need for malaria prophylaxis       Relevant Medications   atovaquone-proguanil  (MALARONE) 250-100 MG TABS tablet   Screening, lipid       Relevant Orders   Lipid panel   Class 1 obesity due to excess calories without serious comorbidity with body mass index (BMI) of 31.0 to 31.9 in adult          1. Wellness examination Patient education given on health maintenance  2. Need for malaria prophylaxis Trial Malarone, patient education given on supportive care. - atovaquone-proguanil (MALARONE) 250-100 MG TABS tablet; Take 1 tablet by mouth daily starting 1 week before you travel.  Dispense: 67 tablet; Refill: 0  3. Screening, lipid  - Lipid panel   4. Class 1 obesity due to excess calories without serious comorbidity with body mass index (BMI) of 31.0 to 31.9 in adult    I have reviewed the patient's medical history (PMH, PSH, Social History, Family History, Medications, and allergies) , and have been updated if relevant. I spent 20 minutes reviewing chart and  face to face time with patient.    Return if symptoms worsen or fail to improve.    Loraine Grip Mayers, PA-C

## 2022-07-13 NOTE — Patient Instructions (Signed)
I sent a prescription for malaria prophylaxis to your pharmacy.  We will call you with today's lab result.  Please let us know if there is any else we can do for you.  Kennieth Rad, PA-C Physician Assistant [facility] Medicine [email or web address]   Malaria  Malaria is a disease caused by a type of germ that can live inside a person's body (parasite). The malaria parasite can get into a person's blood when they are bitten by a certain type of mosquito (Anopheles mosquito). These mosquitoes are most common in tropical areas of the world. Usually, they are not found in the [place]. If an infected mosquito bites you, the parasites can travel through your blood to your liver. The parasites mature in your liver. Then they are released into your blood. They can then go into red blood cells. The parasites multiply inside red blood cells and cause the cells to break open (rupture). This infects more red blood cells. Losing red blood cells may cause you to have a low red blood cell count (anemia). What are the causes? This condition is caused by a Plasmodium parasite. Five different types of the parasite can cause the disease. Most cases of malaria come from a mosquito bite. In rare cases, the disease can also be passed: Through donated blood given to a person through an IV (transfusion). Through an organ transplant. By sharing used needles or syringes. From an infected pregnant male to her unborn baby before or during delivery. What increases the risk? This condition is more likely to develop in: People who live or travel in an area of the world where malaria is common. People needing frequent blood transfusions. People receiving an organ transplant. Illegal IV drug use. People who have never been exposed to malaria parasites before. These people have not built up protection (immunity) against the parasites. What are the signs or  symptoms? Symptoms can vary depending on which parasite caused the infection. Symptoms usually come and go or happen in cycles. The first symptoms usually start 10 days to 4 weeks after the mosquito bite. Symptoms for all types of malaria usually happen in this order: Chills, along with headache, muscle aches, tiredness (fatigue), nausea, and vomiting. Fever, along with hot and dry skin. Headache. Low blood pressure (hypotension). Drenching sweats, along with weakness and exhaustion. Other symptoms include: Diarrhea or bloody poop (stool). Blood in the pee (urine). Low blood sugar (hypoglycemia). Yellowing of the skin and the whites of the eyes (jaundice). Enlarged spleen or liver. Kidney problems. Severe symptoms include: Trouble breathing. Seizures. Confusion or loss of consciousness (coma). Problems with blood clotting. How is this diagnosed? This condition may be diagnosed based on: Your symptoms and medical history. Your health care provider may suspect malaria if you have been living or traveling in an area where the disease is common. A physical exam. Blood tests to confirm the diagnosis. These may include: Looking at a blood sample under a microscope. This is the most common way to diagnose malaria. Each type of parasite looks different under the microscope. Finding out which parasite is causing your infection will help your provider decide which medicines will work best for treatment. Complete blood count (CBC) to check for anemia. How is this treated? This condition may be treated with many different medicines or combinations of medicines. This often requires treatment in the hospital. The best treatment for you will depend on: Which type of parasite is causing your infection. Whether various medicines are effective against  it. How you got the infection. How severe your infection is. Your age and your general health. If you are pregnant. Follow these instructions at  home: Take over-the-counter and prescription medicines only as told by your provider. Rest at home until your symptoms improve. Drink enough fluid to keep your pee pale yellow. Keep all follow-up visits. You may need blood tests to check if the treatment is working. How is this prevented?  If you will be traveling to an area where malaria is common: Visit the Centers for Disease Control and Prevention (CDC) to check your risk: StoreMirror.com.cy Visit your provider at least 2-4 weeks before you leave. You may be given a medicine to help prevent malaria. You can also prevent malaria by: Using insect repellent that has diethyltoluamide (DEET) in it. Staying indoors after dusk, when daytime starts to turn to night. Wearing clothing that covers your arms and legs. Hanging a mosquito net over your bed. Where to find more information World Health Organization: http://curry.org/ Centers for Disease Control and Prevention: StoreMirror.com.cy Contact a health care provider if: You have any malaria symptoms. Your skin or the white parts of your eyes turn yellow. You have bleeding. Get help right away if: You have a seizure. You have trouble breathing. These symptoms may be an emergency. Get help right away. Call 911. Do not wait to see if the symptoms will go away. Do not drive yourself to the hospital. This information is not intended to replace advice given to you by your health care provider. Make sure you discuss any questions you have with your health care provider. Document Revised: 01/21/2022 Document Reviewed: 01/21/2022 Elsevier Patient Education  [place].

## 2022-07-14 LAB — LIPID PANEL
Chol/HDL Ratio: 4.6 ratio (ref 0.0–5.0)
Cholesterol, Total: 151 mg/dL (ref 100–199)
HDL: 33 mg/dL — ABNORMAL LOW (ref >39)
LDL Chol Calc (NIH): 99 mg/dL (ref 0–99)
Triglycerides: 103 mg/dL (ref 0–149)
VLDL Cholesterol Cal: 19 mg/dL (ref 5–40)

## 2023-03-16 ENCOUNTER — Other Ambulatory Visit: Payer: 59

## 2023-03-16 ENCOUNTER — Other Ambulatory Visit: Payer: Self-pay

## 2023-03-16 DIAGNOSIS — B181 Chronic viral hepatitis B without delta-agent: Secondary | ICD-10-CM

## 2023-03-16 NOTE — Addendum Note (Signed)
Addended by: Harley Alto on: 03/16/2023 09:03 AM   Modules accepted: Orders

## 2023-03-23 LAB — COMPLETE METABOLIC PANEL WITH GFR
AG Ratio: 1.5 (calc) (ref 1.0–2.5)
ALT: 22 U/L (ref 9–46)
AST: 21 U/L (ref 10–40)
Albumin: 4.5 g/dL (ref 3.6–5.1)
Alkaline phosphatase (APISO): 47 U/L (ref 36–130)
BUN: 17 mg/dL (ref 7–25)
CO2: 26 mmol/L (ref 20–32)
Calcium: 9.9 mg/dL (ref 8.6–10.3)
Chloride: 102 mmol/L (ref 98–110)
Creat: 0.99 mg/dL (ref 0.60–1.29)
Globulin: 3.1 g/dL (ref 1.9–3.7)
Glucose, Bld: 107 mg/dL — ABNORMAL HIGH (ref 65–99)
Potassium: 4 mmol/L (ref 3.5–5.3)
Sodium: 138 mmol/L (ref 135–146)
Total Bilirubin: 0.5 mg/dL (ref 0.2–1.2)
Total Protein: 7.6 g/dL (ref 6.1–8.1)
eGFR: 98 mL/min/{1.73_m2} (ref >=60)

## 2023-03-23 LAB — LIVER FIBROSIS, FIBROTEST-ACTITEST
ALT: 21 U/L (ref 9–46)
Alpha-2-Macroglobulin: 168 mg/dL (ref 106–279)
Apolipoprotein A1: 116 mg/dL (ref 94–176)
Bilirubin: 0.4 mg/dL (ref 0.2–1.2)
Fibrosis Score: 0.18
GGT: 21 U/L (ref 3–95)
Haptoglobin: 84 mg/dL (ref 43–212)
Necroinflammat ACT Score: 0.07
Reference ID: 5190274

## 2023-03-23 LAB — HEPATITIS B DNA, ULTRAQUANTITATIVE, PCR
Hepatitis B DNA: 758 [IU]/mL — ABNORMAL HIGH
Hepatitis B virus DNA: 2.88 {Log_IU}/mL — ABNORMAL HIGH

## 2023-03-23 LAB — HEPATITIS B E ANTIGEN: Hep B E Ag: NONREACTIVE

## 2023-03-23 LAB — HEPATITIS B E ANTIBODY: Hep B E Ab: REACTIVE — AB

## 2023-03-29 ENCOUNTER — Encounter: Payer: Self-pay | Admitting: Internal Medicine

## 2023-03-29 ENCOUNTER — Ambulatory Visit (INDEPENDENT_AMBULATORY_CARE_PROVIDER_SITE_OTHER): Payer: 59 | Admitting: Internal Medicine

## 2023-03-29 ENCOUNTER — Other Ambulatory Visit: Payer: Self-pay

## 2023-03-29 VITALS — BP 118/79 | HR 86 | Temp 97.4°F | Wt 205.0 lb

## 2023-03-29 DIAGNOSIS — B181 Chronic viral hepatitis B without delta-agent: Secondary | ICD-10-CM | POA: Diagnosis not present

## 2023-03-29 NOTE — Progress Notes (Signed)
[facility]  Patient Active Problem List   Diagnosis Date Noted   Travel advice encounter 09/30/2020   Precordial pain 08/02/2020   Thrombocytopenia (HCC) 07/23/2018   Chronic viral hepatitis B without delta-agent (HCC) 02/21/2017    Patient's Medications  New Prescriptions   No medications on file  Previous Medications   ATOVAQUONE-PROGUANIL (MALARONE) 250-100 MG TABS TABLET    Take 1 tablet by mouth daily starting 1 week before you travel.   DOXYCYCLINE (VIBRAMYCIN) 100 MG CAPSULE    Take 1 capsule (100 mg total) by mouth 2 (two) times daily.  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 Has malaria prophylaxis    Patient was briefly on hep medication in [place] 2016    Review of Systems: Review of Systems  Constitutional:  Negative for chills, diaphoresis, fever and weight loss.  Gastrointestinal:  Negative for abdominal pain, diarrhea, nausea and vomiting.  Skin:  Negative for rash.    Past Medical History:  Diagnosis Date   Chronic viral hepatitis B without delta-agent (HCC)    Mass of hand, right 03/2017    Social History   Tobacco Use   Smoking status: Never   Smokeless tobacco: Never   Tobacco comments:    uses hookah rarely  Vaping Use   Vaping status: Former  Substance Use Topics   Alcohol use: No   Drug use: No    No family history on file.  No Known Allergies  Objective: Vitals:   03/29/23  0906  BP: 118/79  Pulse: 86  Temp: (!) 97.4 F (36.3 C)  TempSrc: Oral  SpO2: 96%  Weight: 205 lb (93 kg)   Body mass index is 32.56 kg/m.  Physical Exam Constitutional:      Comments: He is in good spirits.  Abdominal:     General: There is no distension.     Palpations: Abdomen is soft. There is no mass.     Tenderness: There is no abdominal tenderness.  Skin:    Findings: No rash.  Neurological:     Mental Status: He is alert.  Psychiatric:        Mood and Affect: Mood normal.     Lab Results 03/15/2022 Liver enzymes normal Hepatitis B e antigen negative Hepatitis B DNA viral load 460 IU/ml  Fibrotest 09/30/2020 F0   Problem List Items Addressed This Visit       Digestive   Chronic viral hepatitis B without delta-agent (HCC) - Primary   Relevant Orders   US Abdomen Limited RUQ (LIVER/GB)    Patient briefly on treatment 2016 but taken off at [facility] His 2023 onward lft and hepatitis b dna level have not met new who criteria for treatment  We reviewed new who guideline for treatment (alt > 35 or dna level > 2000) We reviewed hcc screening guideline and will need to start ordering  Lft and cbc from  02/2023 continues to not meet criteria for treatment  U/s ordered F/u 6 months   Raymondo Band, MD [facility] [place] [facility]  03/29/2023, 9:11 AM

## 2023-03-29 NOTE — Patient Instructions (Signed)
I have ordered liver u/s for you, please set this up at the front desk   See me in 6 moths for repeat blood tests and u/s

## 2023-03-30 ENCOUNTER — Ambulatory Visit: Payer: Self-pay | Admitting: Internal Medicine

## 2023-04-05 ENCOUNTER — Ambulatory Visit (HOSPITAL_COMMUNITY)
Admission: RE | Admit: 2023-04-05 | Discharge: 2023-04-05 | Disposition: A | Discharge status: Home / Self Care | Payer: 59 | Source: Ambulatory Visit | Attending: Internal Medicine | Admitting: Internal Medicine

## 2023-04-05 DIAGNOSIS — B181 Chronic viral hepatitis B without delta-agent: Secondary | ICD-10-CM | POA: Diagnosis present

## 2023-09-20 ENCOUNTER — Encounter: Payer: Self-pay | Admitting: Family Medicine

## 2023-09-20 ENCOUNTER — Ambulatory Visit (INDEPENDENT_AMBULATORY_CARE_PROVIDER_SITE_OTHER): Admitting: Family Medicine

## 2023-09-20 VITALS — BP 112/80 | HR 77 | Ht 68.0 in | Wt 211.0 lb

## 2023-09-20 DIAGNOSIS — M545 Low back pain, unspecified: Secondary | ICD-10-CM | POA: Diagnosis not present

## 2023-09-20 DIAGNOSIS — Z0000 Encounter for general adult medical examination without abnormal findings: Secondary | ICD-10-CM | POA: Diagnosis not present

## 2023-09-20 NOTE — Patient Instructions (Signed)
 BACK PAIN AVS  For many people, back pain returns. Since low back pain is rarely dangerous, it is often a condition that people can learn to manage on their own.  Remain active. It is stressful on the back to sit or stand in one place. Do not sit, drive, or stand in one place for more than 30 minutes at a time. Take short walks on level surfaces as soon as pain allows. Try to increase the length of time you walk each day.  Do not stay in bed. Resting more than 1 or 2 days can delay your recovery.  Do not avoid exercise or work. Your body is made to move. It is not dangerous to be active, even though your back may hurt. Your back will likely heal faster if you return to being active before your pain is gone.  Pay attention to your body when you  bend and lift. Many people have less discomfort when lifting if they bend their knees, keep the load close to their bodies, and avoid twisting. Often, the most comfortable positions are those that put less stress on your recovering back.  Find a comfortable position to sleep. Use a firm mattress and lie on your side with your knees slightly bent. If you lie on your back, put a pillow under your knees.  Only take over-the-counter or prescription medicines as directed by your caregiver. Over-the-counter medicines to reduce pain and inflammation are often the most helpful. Your caregiver may prescribe muscle relaxant drugs. These medicines help dull your pain so you can more quickly return to your normal activities and healthy exercise.  Put ice on the injured area.  Put ice in a plastic bag.  Place a towel between your skin and the bag.  Leave the ice on for 15 to 20 minutes, 3 to 4 times a day for the first 2 to 3 days. After that, ice and heat may be alternated to reduce pain and spasms.  Ask your caregiver about trying back exercises and gentle massage. This may be of some benefit.  Avoid feeling anxious or stressed. Stress increases muscle tension and can  worsen back pain. It is important to recognize when you are anxious or stressed and learn ways to manage it. Exercise is a great option.  SEEK IMMEDIATE MEDICAL CARE IF:  You have pain that radiates from your back into your legs.  You develop new bowel or bladder control problems.  You have unusual weakness or numbness in your arms or legs.  You develop nausea or vomiting.  You develop abdominal pain.  You feel faint.    BACK PAIN PLAN Plan of rest, intermittent application of cold packs (later, may switch to heat, but do not sleep on heating pad) Home exercises discussed. Handout provided.  Proper lifting mechanics with avoidance of heavy lifting discussed.  Recommended Physical Therapy  Consider XRay studies if not improving

## 2023-09-20 NOTE — Progress Notes (Signed)
 New Patient Office Visit  Subjective    Patient ID: Caleb Chandler, male    DOB: June 09, 1980  Age: 43 y.o. MRN: [id number]  CC:  Chief Complaint  Patient presents with   Establish Care    HPI [name] presents to establish care   Discussed the use of AI scribe software for clinical note transcription with the patient, who gave verbal consent to proceed.  History of Present Illness Caleb Chandler "Raouf" is a 43 year old male with chronic viral hepatitis B who presents with low back pain.  He experiences low back pain on the right side, described as stabbing and squeezing. The pain has been present for years, fluctuating in intensity, but has recently become more uncomfortable, affecting his sleep. It is localized above the hip and does not radiate down the leg. No urinary problems or recent back injuries are reported. He recalls a fall from a mango tree at age 7, resulting in a broken hand. He rates the pain as a 6 out of 10 today and has not taken any medication for it, preferring to manage without medication.  He also experiences frequent frontal headaches, which he attributes to possible allergies. These headaches respond to Tylenol  or Advil .  He has a history of chronic viral hepatitis B and follows up with [facility] regularly, undergoing blood work every year. Not currently meeting criteria for treatment. He denies any current prescription medication use and has no known drug allergies.  He has a large family with 28 siblings from different mothers, 10 from his own mother. No major family history of heart disease, cancer, or diabetes is reported.  He denies substance use.         Outpatient Encounter Medications as of 09/20/2023  Medication Sig   calcium carbonate (TUMS EX) 750 MG chewable tablet Chew 1 tablet by mouth daily.   [DISCONTINUED] atovaquone -proguanil (MALARONE ) 250-100 MG TABS tablet Take 1 tablet by mouth daily starting 1 week before you travel.  (Patient not taking: Reported on 03/29/2023)   [DISCONTINUED] doxycycline  (VIBRAMYCIN ) 100 MG capsule Take 1 capsule (100 mg total) by mouth 2 (two) times daily. (Patient not taking: Reported on 04/07/2021)   No facility-administered encounter medications on file as of 09/20/2023.    Past Medical History:  Diagnosis Date   Chronic viral hepatitis B without delta-agent (HCC)    Mass of hand, right 03/2017    Past Surgical History:  Procedure Laterality Date   EXCISION METACARPAL MASS Right 04/03/2017   Procedure: RIGHT HAND MASS EXCISION;  Surgeon: Rober Chimera, MD;  Location: [facility];  Service: Orthopedics;  Laterality: Right;    History reviewed. No pertinent family history.  Social History   Socioeconomic History   Marital status: Married    Spouse name: Not on file   Number of children: 1   Years of education: Not on file   Highest education level: Bachelor's degree (e.g., BA, AB, BS)  Occupational History   Not on file  Tobacco Use   Smoking status: Never   Smokeless tobacco: Never   Tobacco comments:    uses hookah rarely  Vaping Use   Vaping status: Former  Substance and Sexual Activity   Alcohol use: No   Drug use: No   Sexual activity: Not Currently    Birth control/protection: None    Comment: none x 6 months  Other Topics Concern   Not on file  Social History Narrative   Not on file  Social Drivers of Corporate investment banker Strain: Low Risk  (09/19/2023)   Overall Financial Resource Strain (CARDIA)    Difficulty of Paying Living Expenses: Not very hard  Food Insecurity: No Food Insecurity (09/19/2023)   Hunger Vital Sign    Worried About Running Out of Food in the Last Year: Never true    Ran Out of Food in the Last Year: Never true  Transportation Needs: No Transportation Needs (09/19/2023)   PRAPARE - Administrator, Civil Service (Medical): No    Lack of Transportation (Non-Medical): No  Physical Activity: Unknown  (09/19/2023)   Exercise Vital Sign    Days of Exercise per Week: 0 days    Minutes of Exercise per Session: Not on file  Stress: No Stress Concern Present (09/19/2023)   [employer] of Occupational Health - Occupational Stress Questionnaire    Feeling of Stress : Only a little  Social Connections: Moderately Integrated (09/19/2023)   Social Connection and Isolation Panel [NHANES]    Frequency of Communication with Friends and Family: More than three times a week    Frequency of Social Gatherings with Friends and Family: More than three times a week    Attends Religious Services: More than 4 times per year    Active Member of [employer] or Organizations: No    Attends Engineer, structural: Not on file    Marital Status: Married  Catering manager Violence: Not on file    ROS All review of systems negative except what is listed in the HPI     Objective    BP 112/80   Pulse 77   Ht 5\' 8"  (1.727 m)   Wt 211 lb (95.7 kg)   SpO2 96%   BMI 32.08 kg/m   Physical Exam Vitals reviewed.  Constitutional:      Appearance: Normal appearance.  Cardiovascular:     Rate and Rhythm: Normal rate and regular rhythm.  Pulmonary:     Effort: Pulmonary effort is normal.     Breath sounds: Normal breath sounds.  Musculoskeletal:        General: Normal range of motion.     Comments: Mild tenderness to right low back paraspinal muscles and SI region  Skin:    General: Skin is warm and dry.  Neurological:     Mental Status: He is alert and oriented to person, place, and time.  Psychiatric:        Mood and Affect: Mood normal.        Behavior: Behavior normal.        Thought Content: Thought content normal.        Judgment: Judgment normal.           Assessment & Plan:   Problem List Items Addressed This Visit   None Visit Diagnoses       Encounter for medical examination to establish care    -  Primary     Right-sided low back pain without sciatica, unspecified  chronicity       Relevant Orders   Ambulatory referral to Physical Therapy      Assessment & Plan Chronic low back pain Chronic right-sided low back pain, exacerbated recently, causing sleep disturbances. Prefers non-pharmacological management. - Refer to physical therapy for evaluation and treatment. - Provide home exercise and stretching handouts. - Advise heating pad and massage for relief. - Recommend lidocaine  patches for daytime use. - Reassess in one month; consider x-ray if no improvement.  Patient aware of signs/symptoms requiring further/urgent evaluation.   Return in about 3 months (around 12/21/2023) for physical.   Everlina Hock, NP

## 2023-10-05 ENCOUNTER — Ambulatory Visit: Payer: 59 | Admitting: Internal Medicine

## 2023-10-17 ENCOUNTER — Ambulatory Visit: Attending: Family Medicine

## 2023-10-17 DIAGNOSIS — M25651 Stiffness of right hip, not elsewhere classified: Secondary | ICD-10-CM | POA: Diagnosis present

## 2023-10-17 DIAGNOSIS — M5459 Other low back pain: Secondary | ICD-10-CM | POA: Insufficient documentation

## 2023-10-17 DIAGNOSIS — M545 Low back pain, unspecified: Secondary | ICD-10-CM | POA: Insufficient documentation

## 2023-10-17 NOTE — Therapy (Unsigned)
 OUTPATIENT PHYSICAL THERAPY THORACOLUMBAR EVALUATION   Patient Name: Caleb Chandler MRN: [id number] DOB:19-Feb-1981, 43 y.o., male Today's Date: 10/18/2023  END OF SESSION:  PT End of Session - 10/18/23 1737     Visit Number 1    Date for PT Re-Evaluation 12/12/23    Progress Note Due on Visit 10    PT Start Time 0845    PT Stop Time 0930    PT Time Calculation (min) 45 min    Activity Tolerance Patient tolerated treatment well    Behavior During Therapy [facility] for tasks assessed/performed              Past Medical History:  Diagnosis Date   Chronic viral hepatitis B without delta-agent (HCC)    Mass of hand, right 03/2017   Past Surgical History:  Procedure Laterality Date   EXCISION METACARPAL MASS Right 04/03/2017   Procedure: RIGHT HAND MASS EXCISION;  Surgeon: Rober Chimera, MD;  Location: [facility];  Service: Orthopedics;  Laterality: Right;   Patient Active Problem List   Diagnosis Date Noted   Travel advice encounter 09/30/2020   Precordial pain 08/02/2020   Thrombocytopenia (HCC) 07/23/2018   Chronic viral hepatitis B without delta-agent (HCC) 02/21/2017    PCP: Minna Amass, NP  REFERRING PROVIDER: same   REFERRING DIAG: chronic R sided lower back pain  Rationale for Evaluation and Treatment: Rehabilitation  THERAPY DIAG:  Other low back pain - Plan: PT plan of care cert/re-cert  Stiffness of right hip, not elsewhere classified - Plan: PT plan of care cert/re-cert  ONSET DATE: chronic, several years  SUBJECTIVE:                                                                                                                                                                                           SUBJECTIVE STATEMENT: R lower back hurts constantly, never eases up completely, affects me when I sit and when I try to lie on R side .  Marvell Slider out of a tree when I was 12 and broke my hand, that is the only trauma I can think of in my life, I  dont know if that fall is what originally hurt it.  PERTINENT HISTORY:  Hepatitis B, h/o R hand fracture  PAIN:  Are you having pain? Yes: NPRS scale: 3 to 7 Pain location: R lower back along superior border of R iliac crest Pain description: aching, stiff Aggravating factors: lying on R side, sitting Relieving factors: walking   PRECAUTIONS: None  RED FLAGS: None   WEIGHT BEARING RESTRICTIONS: No  FALLS:  Has patient fallen in last 6 months?  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  DIAGNOSTIC FINDINGS:  na  PATIENT SURVEYS:  Modified Oswestry Modified Oswestry Low Back Pain Disability Questionnaire: 13 / 50 = 26.0 %   COGNITION: Overall cognitive status: Within functional limits for tasks assessed     SENSATION: WFL  MUSCLE LENGTH: Hamstrings: Right wfl deg; Left wfl deg Andy Bannister test: Right wnl deg; Left -10 deg, with provocative pain R lower back  POSTURE: R rib hump noted with standing lumbar forward flexion, in standing loss of gluteal mass, loss of lumbar lordosis, no significant difference in iliac crest height  PALPATION: Pt pt tender R piriformis and post Si jt line, also superior iliac crest at quadratus lumborum attachments and glut medius  LUMBAR ROM:   AROM eval  Flexion Wnl but p!  Extension wnl  Right lateral flexion wnl  Left lateral flexion P! 70  Right rotation   Left rotation    (Blank rows = not tested)  LOWER EXTREMITY ROM:     AAROM  Right eval Left eval  Hip flexion 95 115  Hip extension wnl wnl  Hip abduction    Hip adduction     Hip internal rotation 15 15  Hip external rotation wnl wnl  Knee flexion    Knee extension    Ankle dorsiflexion    Ankle plantarflexion    Ankle inversion    Ankle eversion     (Blank rows = wnl)  LOWER EXTREMITY MMT:    MMT Right eval Left eval  Hip flexion 4   Hip extension lock bridge 5 4, P! R lumbar  Hip abduction 4 5  Hip adduction    Hip internal rotation    Hip external rotation 3-   Knee flexion    Knee extension    Ankle dorsiflexion heel walk wnl wnl  Ankle plantarflexion toe walk wnl wnl  Ankle inversion    Ankle eversion     (Blank rows = wnl)  LUMBAR SPECIAL TESTS:  Straight leg raise test: Positive, Slump test: Positive, FABER test: Negative, and Thomas test: Negative  FUNCTIONAL TESTS:  30 seconds chair stand test  18, leans to L to unload R leg  GAIT: Distance walked: in clinic wfl,no deficits  TREATMENT DATE: 10/18/23:  evaluation,  Supine for mob with movement for R hip flexion with sustained lat glide R hip jt, initially improved Sx and ROM, then aggravated Side lying R for muscle energy for flex/rot/ SB restriction mild improvement in sx In supine manually resisted isometric hip abd/ER, with relief noted in post hip Sx  Instructed in seated ex that pt may perform throughout his work shift: Instructed in seated isometric hip abd/ER with non elastic strap, 10 sec holds, 6 reps to increase stability SI and lower lumbar  Also instructed in isometric hip ext, B in sitting  , hands for resistance  PATIENT EDUCATION:  Education details: POC, goals Person educated: Patient Education method: Programmer, multimedia, Demonstration, Actor cues, Verbal cues, and Handouts Education comprehension: returned demonstration  HOME EXERCISE PROGRAM: TBD  ASSESSMENT:  CLINICAL IMPRESSION: Patient is a 43 y.o. male who was evaluated today by  physical therapy due to chronic R sided lower back pain,  evaluation reveals really good flexibility spine, but some decreased Rom R hip for flexion, IR with replication of R LB pain. Also weakness R hip flexion, Er, ext.  Pain with palpation R iliac crest along muscular attachments quadratus lumborum and gluteals, also R piriformis.  He did not respond positively to stretching R hip today, improved with initiation and performance of stabilization /hip strengthening, so would recommend physical therapy proceed in that direction. Recommend 1 x week   OBJECTIVE IMPAIRMENTS: decreased mobility, decreased ROM, decreased strength, and pain.   ACTIVITY LIMITATIONS: bending, sitting, and sleeping  PARTICIPATION LIMITATIONS: meal prep, cleaning, driving, and shopping  PERSONAL FACTORS: Behavior pattern, Past/current experiences, Time since onset of injury/illness/exacerbation, and 1-2 comorbidities: hep B are also affecting patient's functional outcome.   REHAB POTENTIAL: Good  CLINICAL DECISION MAKING: Evolving/moderate complexity  EVALUATION COMPLEXITY: Moderate   GOALS: Goals reviewed with patient? Yes  SHORT TERM GOALS: Target date: 2 weeks, 10/31/23  I HEP Baseline: Goal status: INITIAL   LONG TERM GOALS: Target date: 8 weeks, 12/12/23  Pain free and wnl R hip ER and flexion strength Baseline: 3+5 and 4/5 Goal status: INITIAL  2.  Modified Oswestry improve from 26% disability to 15%  Baseline:  Goal status: INITIAL  3.  Full ROM R hip for flexion for improved sitting tolerance Baseline: 95 Goal status: INITIAL    PLAN:  PT FREQUENCY: 1x/week  PT DURATION: 8 weeks  PLANNED INTERVENTIONS: 97110-Therapeutic exercises, 97530- Therapeutic activity, [id number]- Neuromuscular re-education, 97535- Self Care, and 16109- Manual therapy.  PLAN FOR NEXT SESSION: how was therex, recheck R hip strength, ROM and recheck /palpate R lumbar, and utilize manual techniques and progress therex as  needed, emphasis on strength R hip   Dyami Umbach L Rionna Feltes, PT, DPT [facility] 10/18/2023, 5:39 PM

## 2023-10-18 ENCOUNTER — Other Ambulatory Visit: Payer: Self-pay

## 2023-10-24 ENCOUNTER — Ambulatory Visit

## 2023-10-24 ENCOUNTER — Other Ambulatory Visit: Payer: Self-pay

## 2023-10-24 DIAGNOSIS — M5459 Other low back pain: Secondary | ICD-10-CM | POA: Diagnosis not present

## 2023-10-24 DIAGNOSIS — M25651 Stiffness of right hip, not elsewhere classified: Secondary | ICD-10-CM

## 2023-10-24 NOTE — Therapy (Signed)
 OUTPATIENT PHYSICAL THERAPY THORACOLUMBAR TREATMENT   Patient Name: Caleb Chandler MRN: [id number] DOB:[birth year], 43 y.o., male Today's Date: 10/24/2023  END OF SESSION:  PT End of Session - 10/24/23 0807     Visit Number 2    Date for PT Re-Evaluation 12/12/23    Progress Note Due on Visit 10    PT Start Time 0800    PT Stop Time 0845    PT Time Calculation (min) 45 min    Activity Tolerance Patient tolerated treatment well    Behavior During Therapy [facility] for tasks assessed/performed               Past Medical History:  Diagnosis Date   Chronic viral hepatitis B without delta-agent (HCC)    Mass of hand, right 03/2017   Past Surgical History:  Procedure Laterality Date   EXCISION METACARPAL MASS Right 04/03/2017   Procedure: RIGHT HAND MASS EXCISION;  Surgeon: Rober Chimera, MD;  Location: [facility];  Service: Orthopedics;  Laterality: Right;   Patient Active Problem List   Diagnosis Date Noted   Travel advice encounter 09/30/2020   Precordial pain 08/02/2020   Thrombocytopenia (HCC) 07/23/2018   Chronic viral hepatitis B without delta-agent (HCC) 02/21/2017    PCP: Minna Amass, NP  REFERRING PROVIDER: same   REFERRING DIAG: chronic R sided lower back pain  Rationale for Evaluation and Treatment: Rehabilitation  THERAPY DIAG:  Other low back pain  Stiffness of right hip, not elsewhere classified  ONSET DATE: chronic, several years  SUBJECTIVE:                                                                                                                                                                                           SUBJECTIVE STATEMENT: 10/24/23: 6/10 pain today lower back, some better/ some relief when I do  the exercises that you showed me R lower back hurts constantly, never eases up completely, affects me when I sit and when I try to lie on R side .  Marvell Slider out of a tree when I was 12 and broke my hand, that is the only  trauma I can think of in my life, I dont know if that fall is what originally hurt it.  PERTINENT HISTORY:  Hepatitis B, h/o R hand fracture  PAIN:  Are you having pain? Yes: NPRS scale: 3 to 7 Pain location: R lower back along superior border of R iliac crest Pain description: aching, stiff Aggravating factors: lying on R side, sitting Relieving factors: walking   PRECAUTIONS: None  RED FLAGS: None   WEIGHT BEARING RESTRICTIONS: No  FALLS:  Has patient fallen in last 6 months? 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  DIAGNOSTIC FINDINGS:  na  PATIENT SURVEYS:  Modified Oswestry Modified Oswestry Low Back Pain Disability Questionnaire: 13 / 50 = 26.0 %   COGNITION: Overall cognitive status: Within functional limits for tasks assessed     SENSATION: WFL  MUSCLE LENGTH: Hamstrings: Right wfl deg; Left wfl deg Andy Bannister test: Right wnl deg; Left -10 deg, with provocative pain R lower back  POSTURE: R rib hump noted with standing lumbar forward flexion, in standing loss of gluteal mass, loss of lumbar lordosis, no significant difference in iliac crest height  PALPATION: Pt pt tender R piriformis and post Si jt line, also superior iliac crest at quadratus lumborum attachments and glut medius  LUMBAR ROM:   AROM eval  Flexion Wnl but p!  Extension wnl  Right lateral flexion wnl  Left lateral flexion P! 70  Right rotation   Left rotation    (Blank rows = not tested)  LOWER EXTREMITY ROM:     AAROM  Right eval Left eval  Hip flexion 95 115  Hip extension wnl wnl   Hip abduction    Hip adduction    Hip internal rotation 15 15  Hip external rotation wnl wnl  Knee flexion    Knee extension    Ankle dorsiflexion    Ankle plantarflexion    Ankle inversion    Ankle eversion     (Blank rows = wnl)  LOWER EXTREMITY MMT:    MMT Right eval Left eval  Hip flexion 4   Hip extension lock bridge 5 4, P! R lumbar  Hip abduction 4 5  Hip adduction    Hip internal rotation    Hip external rotation 3-   Knee flexion    Knee extension    Ankle dorsiflexion heel walk wnl wnl  Ankle plantarflexion toe walk wnl wnl  Ankle inversion    Ankle eversion     (Blank rows = wnl)  LUMBAR SPECIAL TESTS:  Straight leg raise test: Positive, Slump test: Positive, FABER test: Negative, and Thomas test: Negative  FUNCTIONAL TESTS:  30 seconds chair stand test  18, leans to L to unload R leg  GAIT: Distance walked: in clinic wfl,no deficits  TREATMENT DATE: 10/23/23: Reassessed lumbar Rom, fb and ext in standing wnl R hip flexion in supine with pain and tightness at 100 degrees, no pain or limitation with hip ER/IR ROM   Side lying L for deep tissue massage R gluteals, TFL, quadratus lumborum, utilizing theragun  Advanced strengthening for R hip extensors and hip ER: Trialed supine with heels on 4" block, bridging, too painful R hip switched to supine bridging, advised him on wider stance for less post hip demand, narrowed stance for more intense strengthening. Also added small amplitude hip ER/IR with the bridge to incorporate his weaker musculature  Added L side lying for clam shells R, he was able to perform well.  Advised him regarding performing 3 x week , allowing rest day to allow his muscles to improve between strengthening sessions  Moist heat R  post / lateral hip 8 min at end of session not included in treatment time     10/18/23:  evaluation,  Supine for mob with movement for R hip flexion with sustained lat glide R hip jt, initially improved Sx  and ROM, then aggravated Side lying R for muscle energy for flex/rot/ SB restriction mild improvement in sx In supine manually resisted isometric hip abd/ER, with relief noted in post hip Sx  Instructed in seated ex that pt may perform throughout his work shift: Instructed in seated isometric hip abd/ER with non elastic strap, 10 sec holds, 6 reps to increase stability SI and lower lumbar  Also instructed in isometric hip ext, B in sitting  , hands for resistance                                                                                                                              PATIENT EDUCATION:  Education details: POC, goals Person educated: Patient Education method: Explanation, Demonstration, Tactile cues, Verbal cues, and Handouts Education comprehension: returned demonstration  HOME EXERCISE PROGRAM: Access Code: [email or web address] URL: [email or web address] Date: 10/24/2023 Prepared by: Shaaron Dar  Exercises - Bridge in Hip Abduction and External Rotation  - 1 x daily - 3 x weekly - 2 sets - 10 reps - Clamshell  - 1 x daily - 3 x weekly - 2 sets - 10 reps TBD  ASSESSMENT:  CLINICAL IMPRESSION: Patient is a 43 y.o. male who attended his second physical therapy today  due to chronic R sided lower back pain,  his flexibility R hip is improved as well as strength for R hip ER as compared to initial evaluation.  As during the evaluation, he  improved  performance of stabilization /hip strengthening, so would recommend physical therapy continue in that direction. Recommend 1 x week , advised him to wait 1 1/2 to 2 weeks until next appt .  OBJECTIVE IMPAIRMENTS: decreased mobility, decreased ROM, decreased strength, and pain.   ACTIVITY LIMITATIONS: bending, sitting, and sleeping  PARTICIPATION LIMITATIONS: meal prep, cleaning, driving, and shopping  PERSONAL FACTORS: Behavior pattern, Past/current experiences, Time since onset of injury/illness/exacerbation, and  1-2 comorbidities: hep B are also affecting patient's functional outcome.   REHAB POTENTIAL: Good  CLINICAL DECISION MAKING: Evolving/moderate complexity  EVALUATION COMPLEXITY: Moderate   GOALS: Goals reviewed with patient? Yes  SHORT TERM GOALS: Target date: 2 weeks, 10/31/23  I HEP Baseline: Goal status: INITIAL   LONG TERM GOALS: Target date: 8 weeks, 12/12/23  Pain free and wnl R hip ER and flexion strength Baseline: 3+5 and 4/5 Goal status: INITIAL  2.  Modified Oswestry improve from 26% disability to 15%  Baseline:  Goal status: INITIAL  3.  Full ROM R hip for flexion for improved sitting tolerance Baseline: 95 Goal status: INITIAL    PLAN:  PT FREQUENCY: 1x/week  PT DURATION: 8 weeks  PLANNED INTERVENTIONS: 97110-Therapeutic exercises, 97530- Therapeutic activity, [id number]- Neuromuscular re-education, 97535- Self  Care, and 54098- Manual therapy.  PLAN FOR NEXT SESSION: how was therex, recheck R hip strength, ROM and recheck /palpate R lumbar, and utilize manual techniques and progress therex as needed, emphasis on strength R hip   Ranard Harte L Maekayla Giorgio, PT, DPT [facility] 10/24/2023, 6:19 PM

## 2023-10-31 ENCOUNTER — Other Ambulatory Visit: Payer: Self-pay

## 2023-10-31 ENCOUNTER — Ambulatory Visit (INDEPENDENT_AMBULATORY_CARE_PROVIDER_SITE_OTHER): Admitting: Internal Medicine

## 2023-10-31 ENCOUNTER — Encounter: Payer: Self-pay | Admitting: Internal Medicine

## 2023-10-31 VITALS — BP 129/87 | HR 79 | Resp 16 | Ht 68.0 in | Wt 207.8 lb

## 2023-10-31 DIAGNOSIS — B181 Chronic viral hepatitis B without delta-agent: Secondary | ICD-10-CM

## 2023-10-31 NOTE — Patient Instructions (Signed)
 Guideline suggest every 6 month ultrasound but we'll do every year  Please review who guideline hep b liver cancer screening on google search and let me know if you change your mind about frequency of screening   Labs today   See me in follow up in 6 months

## 2023-10-31 NOTE — Progress Notes (Signed)
 [facility]  Patient Active Problem List   Diagnosis Date Noted   Travel advice encounter 09/30/2020   Precordial pain 08/02/2020   Thrombocytopenia (HCC) 07/23/2018   Chronic viral hepatitis B without delta-agent (HCC) 02/21/2017    Patient's Medications  New Prescriptions   No medications on file  Previous Medications   CALCIUM CARBONATE (TUMS EX) 750 MG CHEWABLE TABLET    Chew 1 tablet by mouth daily.  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 Has malaria prophylaxis    Patient was briefly on hep medication in [place] 2016    Review of Systems: Review of Systems  Constitutional:  Negative for chills, diaphoresis, fever and weight loss.  Gastrointestinal:  Negative for abdominal pain, diarrhea, nausea and vomiting.  Skin:  Negative for rash.    Past Medical History:  Diagnosis Date   Chronic viral hepatitis B without delta-agent (HCC)    Mass of hand, right 03/2017    Social History   Tobacco Use   Smoking status: Never   Smokeless tobacco: Never   Tobacco comments:    uses hookah rarely  Vaping Use   Vaping status: Former  Substance Use Topics   Alcohol use: No   Drug use: No    History reviewed. No pertinent family history.  No Known Allergies  Objective: Vitals:   10/31/23 1027  BP: 129/87  Pulse: 79   Resp: 16  SpO2: 99%  Weight: 207 lb 12.8 oz (94.3 kg)  Height: 5' 8 (1.727 m)   Body mass index is 31.6 kg/m.  Physical Exam Constitutional:      Comments: Caleb Chandler is in good spirits.  Abdominal:     General: There is no distension.     Palpations: Abdomen is soft. There is no mass.     Tenderness: There is no abdominal tenderness.   Skin:    Findings: No rash.   Neurological:     Mental Status: Caleb Chandler is alert.   Psychiatric:        Mood and Affect: Mood normal.    Labs: Lab Results  Component Value Date   WBC 4.6 03/17/2021   HGB 16.7 03/17/2021   HCT 48.6 03/17/2021   MCV 89.5 03/17/2021   PLT 155 03/17/2021   Last metabolic panel Lab Results  Component Value Date   GLUCOSE 107 (H) 03/16/2023   NA 138 03/16/2023   K 4.0 03/16/2023   CL 102 03/16/2023   CO2 26 03/16/2023   BUN 17 03/16/2023   CREATININE 0.99 03/16/2023   EGFR 98 03/16/2023   CALCIUM 9.9 03/16/2023   PROT 7.6 03/16/2023   ALBUMIN 4.7 07/23/2018   LABGLOB 3.2 07/23/2018   AGRATIO 1.5 07/23/2018   BILITOT 0.5 03/16/2023   ALKPHOS 56 07/23/2018   AST 21 03/16/2023   ALT 22 03/16/2023  ALT 21 03/16/2023   ANIONGAP 8 03/29/2017    Fibrotest 09/30/2020 F0   Imaging: Reviewed  03/2023 liver u/s No sign of cirrhosis or hepatoma    Problem List Items Addressed This Visit   None    Patient briefly on treatment 2016 but taken off at [facility] His 2023 onward lft and hepatitis b dna level have not met new who criteria for treatment  We reviewed new who guideline for treatment (alt > 35 or dna level > 2000) We reviewed hcc screening guideline and will need to start ordering  Lft and cbc from 02/2023 continues to not meet criteria for treatment  U/s ordered F/u 6 months  --------- 10/31/23 id clinic assessment Doing well Liver u/s 03/2023 normal; patient wants to do yearly rather than biyearly Guideline hep b care source (who) referred to patient  Labs due today  F/u 6  months Remains off medication at this time     Jamesetta Mcbride, MD [facility]  10/31/2023, 11:00 AM

## 2023-11-02 ENCOUNTER — Encounter

## 2023-11-03 LAB — CBC
HCT: 46.5 % (ref 38.5–50.0)
Hemoglobin: 15.7 g/dL (ref 13.2–17.1)
MCH: 31 pg (ref 27.0–33.0)
MCHC: 33.8 g/dL (ref 32.0–36.0)
MCV: 91.7 fL (ref 80.0–100.0)
MPV: 10.3 fL (ref 7.5–12.5)
Platelets: 157 10*3/uL (ref 140–400)
RBC: 5.07 10*6/uL (ref 4.20–5.80)
RDW: 13.3 % (ref 11.0–15.0)
WBC: 5.1 10*3/uL (ref 3.8–10.8)

## 2023-11-03 LAB — COMPLETE METABOLIC PANEL WITHOUT GFR
AG Ratio: 1.5 (calc) (ref 1.0–2.5)
ALT: 25 U/L (ref 9–46)
AST: 17 U/L (ref 10–40)
Albumin: 4.5 g/dL (ref 3.6–5.1)
Alkaline phosphatase (APISO): 46 U/L (ref 36–130)
BUN: 17 mg/dL (ref 7–25)
CO2: 23 mmol/L (ref 20–32)
Calcium: 9.8 mg/dL (ref 8.6–10.3)
Chloride: 106 mmol/L (ref 98–110)
Creat: 0.86 mg/dL (ref 0.60–1.29)
Globulin: 3.1 g/dL (ref 1.9–3.7)
Glucose, Bld: 94 mg/dL (ref 65–99)
Potassium: 4.1 mmol/L (ref 3.5–5.3)
Sodium: 137 mmol/L (ref 135–146)
Total Bilirubin: 0.4 mg/dL (ref 0.2–1.2)
Total Protein: 7.6 g/dL (ref 6.1–8.1)

## 2023-11-03 LAB — HEPATITIS B DNA, ULTRAQUANTITATIVE, PCR
Hepatitis B DNA: 1430 [IU]/mL — ABNORMAL HIGH
Hepatitis B virus DNA: 3.16 {Log_IU}/mL — ABNORMAL HIGH

## 2023-11-03 LAB — HEPATITIS B SURFACE ANTIBODY, QUANTITATIVE: Hep B S AB Quant (Post): <5 m[IU]/mL — ABNORMAL LOW (ref >=10)

## 2023-11-03 LAB — HEPATITIS B E ANTIGEN: Hep B E Ag: NONREACTIVE

## 2023-11-03 LAB — HEPATITIS B SURFACE ANTIGEN: Hepatitis B Surface Ag: REACTIVE — AB

## 2023-11-03 LAB — AFP TUMOR MARKER: AFP-Tumor Marker: 5.8 ng/mL (ref <6.1)

## 2023-11-03 LAB — HEPATITIS B E ANTIBODY: Hep B E Ab: REACTIVE — AB

## 2023-11-07 ENCOUNTER — Ambulatory Visit

## 2023-11-14 ENCOUNTER — Ambulatory Visit: Attending: Family Medicine

## 2023-11-14 ENCOUNTER — Other Ambulatory Visit: Payer: Self-pay

## 2023-11-14 DIAGNOSIS — M5459 Other low back pain: Secondary | ICD-10-CM | POA: Diagnosis present

## 2023-11-14 DIAGNOSIS — M25651 Stiffness of right hip, not elsewhere classified: Secondary | ICD-10-CM | POA: Insufficient documentation

## 2023-11-14 NOTE — Therapy (Signed)
 OUTPATIENT PHYSICAL THERAPY THORACOLUMBAR TREATMENT   Patient Name: Caleb Chandler MRN: [id number] DOB:[birth year], 43 y.o., male Today's Date: 11/14/2023  END OF SESSION:  PT End of Session - 11/14/23 1105     Visit Number 3    Date for PT Re-Evaluation 12/12/23    Progress Note Due on Visit 10    PT Start Time 0803    PT Stop Time 0842    PT Time Calculation (min) 39 min    Activity Tolerance Patient tolerated treatment well    Behavior During Therapy [facility] for tasks assessed/performed             Past Medical History:  Diagnosis Date   Chronic viral hepatitis B without delta-agent (HCC)    Mass of hand, right 03/2017   Past Surgical History:  Procedure Laterality Date   EXCISION METACARPAL MASS Right 04/03/2017   Procedure: RIGHT HAND MASS EXCISION;  Surgeon: Caleb Lenis, MD;  Location: [facility];  Service: Orthopedics;  Laterality: Right;   Patient Active Problem List   Diagnosis Date Noted   Travel advice encounter 09/30/2020   Precordial pain 08/02/2020   Thrombocytopenia (HCC) 07/23/2018   Chronic viral hepatitis B without delta-agent (HCC) 02/21/2017    PCP: Caleb Birmingham, NP  REFERRING PROVIDER: same   REFERRING DIAG: chronic R sided lower back pain  Rationale for Evaluation and Treatment: Rehabilitation  THERAPY DIAG:  Other low back pain  Stiffness of right hip, not elsewhere classified  ONSET DATE: chronic, several years  SUBJECTIVE:                                                                                                                                                                                           SUBJECTIVE STATEMENT: 11/14/23:  much improved , 90 % better. Sleeping all night, doing the ex.  Pain ebbing and flowing.  Eval:R lower back hurts constantly, never eases up completely, affects me when I sit and when I try to lie on R side .  Clemens out of a tree when I was 12 and broke my hand, that is the only trauma I  can think of in my life, I dont know if that fall is what originally hurt it.  PERTINENT HISTORY:  Hepatitis B, h/o R hand fracture  PAIN:  Are you having pain? Yes: NPRS scale: 3 to 7 Pain location: R lower back along superior border of R iliac crest Pain description: aching, stiff Aggravating factors: lying on R side, sitting Relieving factors: walking   PRECAUTIONS: None  RED FLAGS: None   WEIGHT BEARING RESTRICTIONS: No  FALLS:  Has patient  fallen in last 6 months? 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  DIAGNOSTIC FINDINGS:  na  PATIENT SURVEYS:  Modified Oswestry Modified Oswestry Low Back Pain Disability Questionnaire: 13 / 50 = 26.0 %   COGNITION: Overall cognitive status: Within functional limits for tasks assessed     SENSATION: WFL  MUSCLE LENGTH: Hamstrings: Right wfl deg; Left wfl deg Caleb Chandler: Right wnl deg; Left -10 deg, with provocative pain R lower back  POSTURE: R rib hump noted with standing lumbar forward flexion, in standing loss of gluteal mass, loss of lumbar lordosis, no significant difference in iliac crest height  PALPATION: Pt pt tender R piriformis and post Si jt line, also superior iliac crest at quadratus lumborum attachments and glut medius  LUMBAR ROM:   AROM eval 11/14/23  Flexion Wnl but p! wnl  Extension wnl wnl  Right lateral flexion wnl   Left lateral flexion P! 70   Right rotation    Left rotation     (Blank rows = not tested)  LOWER EXTREMITY ROM:     AAROM  Right eval Left eval 11/14/23, R  Hip flexion 95 115 110   Hip extension wnl wnl   Hip abduction     Hip adduction     Hip internal rotation 15 15   Hip external rotation wnl wnl   Knee flexion     Knee extension     Ankle dorsiflexion     Ankle plantarflexion     Ankle inversion     Ankle eversion      (Blank rows = wnl)  LOWER EXTREMITY MMT:    MMT Right eval Left eval  Hip flexion 4   Hip extension lock bridge 5 4, P! R lumbar  Hip abduction 4 5  Hip adduction    Hip internal rotation    Hip external rotation 3-   Knee flexion    Knee extension    Ankle dorsiflexion heel walk wnl wnl  Ankle plantarflexion toe walk wnl wnl  Ankle inversion    Ankle eversion     (Blank rows = wnl)  LUMBAR SPECIAL TESTS:  Straight leg raise Chandler: Positive, Slump Chandler: Positive, FABER Chandler: Negative, and Thomas Chandler: Negative  FUNCTIONAL TESTS:  30 seconds chair stand Chandler  18, leans to L to unload R leg  GAIT: Distance walked: in clinic wfl,no deficits  TREATMENT DATE: 11/14/23:  reassessed lumbar and R hip ROM. Wnl Pt demonstrated his ex from last session, bridging and L side lying for clam shells Side lying for deep cross friction massage R post lat hip musculature, Quadratus lumborum With the theragun  In standing provided with 1/8 lift for R shoe.  Noted R lateral trunk lean and R iliac crest sitting lower than L in standing. Utilizing the lift for trial. Pt felt comfortable with the lift., lumbar ROM with less trunk side bending and rotation when lift intact.   In gym,  Leg press R LE only 25# x 2 sets 10 reps Standing for R hip abd with pulley stack 5# 10 x 2 sets  10/23/23: Reassessed lumbar  Rom, fb and ext in standing wnl R hip flexion in supine with pain and tightness at 100 degrees, no pain or limitation with hip ER/IR ROM   Side lying L for deep tissue massage R gluteals, TFL, quadratus lumborum, utilizing theragun  Advanced strengthening for R hip extensors and hip ER: Trialed supine with heels on 4 block, bridging, too  painful R hip switched to supine bridging, advised him on wider stance for less post hip demand, narrowed stance for more intense strengthening. Also added small amplitude hip ER/IR with the bridge to incorporate his weaker musculature  Added L side lying for clam shells R, he was able to perform well.  Advised him regarding performing 3 x week , allowing rest day to allow his muscles to improve between strengthening sessions  Moist heat R post / lateral hip 8 min at end of session not included in treatment time     10/18/23:  evaluation,  Supine for mob with movement for R hip flexion with sustained lat glide R hip jt, initially improved Sx and ROM, then aggravated Side lying R for muscle energy for flex/rot/ SB restriction mild improvement in sx In supine manually resisted isometric hip abd/ER, with relief noted in post hip Sx  Instructed in seated ex that pt may perform throughout his work shift: Instructed in seated isometric hip abd/ER with non elastic strap, 10 sec holds, 6 reps to increase stability SI and lower lumbar  Also instructed in isometric hip ext, B in sitting  , hands for resistance                                                                                                                              PATIENT EDUCATION:  Education details: POC, goals Person educated: Patient Education method: Explanation, Demonstration, Tactile cues, Verbal cues, and Handouts Education comprehension: returned demonstration  HOME EXERCISE PROGRAM: Access Code: [email or web address] URL: [email or web address] Date: 10/24/2023 Prepared by: Greig Credit  Exercises - Bridge in Hip Abduction and External Rotation  - 1 x daily - 3 x weekly - 2 sets - 10 reps - Clamshell  - 1 x daily - 3 x weekly - 2 sets - 10 reps TBD  ASSESSMENT:  CLINICAL IMPRESSION: Patient is a 43 y.o. male who attended his third physical therapy today  due to chronic R sided lower back pain,  his flexibility  R hip is improved as well as strength for R hip ER as compared to initial evaluation. Addressed iliac crest height difference today as well.  He states he is 90% better. Wants to return in 2 weeks for additional assessment.    OBJECTIVE IMPAIRMENTS: decreased mobility, decreased ROM, decreased strength, and pain.   ACTIVITY LIMITATIONS: bending, sitting, and sleeping  PARTICIPATION LIMITATIONS: meal prep, cleaning, driving, and shopping  PERSONAL FACTORS: Behavior pattern, Past/current experiences, Time since onset of injury/illness/exacerbation, and 1-2 comorbidities: hep B are also affecting patient's functional outcome.   REHAB POTENTIAL:  Good  CLINICAL DECISION MAKING: Evolving/moderate complexity  EVALUATION COMPLEXITY: Moderate   GOALS: Goals reviewed with patient? Yes  SHORT TERM GOALS: Target date: 2 weeks, 10/31/23  I HEP Baseline: Goal status: INITIAL 11/14/23: met   LONG TERM GOALS: Target date: 8 weeks, 12/12/23  Pain free and wnl R hip ER and flexion strength Baseline: 3+5 and 4/5 Goal status: INITIAL  2.  Modified Oswestry improve from 26% disability to 15%  Baseline:  Goal status: INITIAL  3.  Full ROM R hip for flexion for improved sitting tolerance Baseline: 95 Goal status: met so far 11/14/23     PLAN:  PT FREQUENCY: 1x/week  PT DURATION: 8 weeks  PLANNED INTERVENTIONS: 97110-Therapeutic exercises, 97530- Therapeutic activity, 97112- Neuromuscular re-education, 97535- Self Care, and 02859- Manual therapy.  PLAN FOR NEXT SESSION: how was therex, recheck R hip strength, ROM and recheck /palpate R lumbar, and utilize manual techniques and progress therex as needed, emphasis on strength R hip   Katrianna Friesenhahn L Mervin Ramires, PT, DPT [facility] 11/14/2023, 11:29 AM

## 2023-11-29 ENCOUNTER — Encounter: Payer: Self-pay | Admitting: Family Medicine

## 2023-11-30 ENCOUNTER — Ambulatory Visit

## 2023-12-04 ENCOUNTER — Encounter: Payer: Self-pay | Admitting: Family Medicine

## 2023-12-04 ENCOUNTER — Ambulatory Visit (INDEPENDENT_AMBULATORY_CARE_PROVIDER_SITE_OTHER): Admitting: Family Medicine

## 2023-12-04 VITALS — BP 132/89 | HR 80 | Temp 98.0°F | Ht 68.0 in | Wt 210.0 lb

## 2023-12-04 DIAGNOSIS — R37 Sexual dysfunction, unspecified: Secondary | ICD-10-CM

## 2023-12-04 DIAGNOSIS — N50812 Left testicular pain: Secondary | ICD-10-CM | POA: Diagnosis not present

## 2023-12-04 DIAGNOSIS — N50811 Right testicular pain: Secondary | ICD-10-CM

## 2023-12-04 NOTE — Progress Notes (Signed)
   Acute Office Visit  Subjective:     Patient ID: Caleb Chandler, male    DOB: Feb 10, 1981, 43 y.o.   MRN: [id number]     HPI Patient is in today for testicular concerns.  Discussed the use of AI scribe software for clinical note transcription with the patient, who gave verbal consent to proceed.  History of Present Illness Caleb Chandler is a 43 year old male who presents with erectile dysfunction and testicular discomfort.  He has been experiencing erectile dysfunction for the past two to three years, initially intermittent but now more consistent. He describes occasional pain during erections, with a severity of 6 to 6.5 out of 10. No discharge, trouble urinating, swelling, or rashes are noted.  He reports a sensation of 'stabbing' or pressure in the testicles when sitting, unrelated to intercourse. Additionally, he experiences a burning sensation under the skin of the testicles every other day, lasting a couple of hours, described as 'hot' or 'spicy'. This burning is not associated with urination. The pain affects both sides, more on the bottom, and can last from 10 to 30 minutes. No fever, chills, or soreness in the groin area.  He has never seen a urologist before and has not had an ultrasound of his scrotum or testicles.       ROS All review of systems negative except what is listed in the HPI      Objective:    BP 132/89   Pulse 80   Temp 98 F (36.7 C) (Oral)   Ht 5' 8 (1.727 m)   Wt 210 lb (95.3 kg)   SpO2 97%   BMI 31.93 kg/m    Physical Exam Vitals reviewed. Exam conducted with a chaperone present.  Constitutional:      Appearance: Normal appearance.  Genitourinary:    Penis: Normal.      Testes:        Right: Tenderness present. Swelling not present.        Left: Tenderness present. Swelling not present.  Neurological:     Mental Status: He is alert and oriented to person, place, and time.  Psychiatric:        Mood and Affect: Mood  normal.        Behavior: Behavior normal.        Thought Content: Thought content normal.        Judgment: Judgment normal.     No results found for any visits on 12/04/23.      Assessment & Plan:   Problem List Items Addressed This Visit   None Visit Diagnoses       Pain in both testicles    -  Primary   Relevant Orders   Ambulatory referral to Urology   US  SCROTUM W/DOPPLER     Sexual dysfunction       Relevant Orders   Ambulatory referral to Urology   US  SCROTUM W/DOPPLER      Mild tenderness on exam; no obvious swelling or rashes. This has been a chronic issues, but gradually becoming more consistent. Will get US  and refer to urology to further evaluate.  Patient aware of signs/symptoms requiring further/urgent evaluation.   No orders of the defined types were placed in this encounter.   Return if symptoms worsen or fail to improve.  Waddell KATHEE Mon, NP

## 2023-12-05 ENCOUNTER — Ambulatory Visit (HOSPITAL_BASED_OUTPATIENT_CLINIC_OR_DEPARTMENT_OTHER)
Admission: RE | Admit: 2023-12-05 | Discharge: 2023-12-05 | Disposition: A | Discharge status: Home / Self Care | Source: Ambulatory Visit | Attending: Family Medicine | Admitting: Family Medicine

## 2023-12-05 DIAGNOSIS — N50812 Left testicular pain: Secondary | ICD-10-CM | POA: Insufficient documentation

## 2023-12-05 DIAGNOSIS — N50811 Right testicular pain: Secondary | ICD-10-CM | POA: Insufficient documentation

## 2023-12-05 DIAGNOSIS — R37 Sexual dysfunction, unspecified: Secondary | ICD-10-CM | POA: Diagnosis present

## 2023-12-06 ENCOUNTER — Ambulatory Visit: Payer: Self-pay | Admitting: Family Medicine

## 2023-12-26 ENCOUNTER — Encounter: Payer: Self-pay | Admitting: Family Medicine

## 2023-12-26 ENCOUNTER — Ambulatory Visit: Admitting: Family Medicine

## 2023-12-26 VITALS — BP 126/88 | HR 68 | Ht 68.0 in | Wt 207.0 lb

## 2023-12-26 DIAGNOSIS — Z0000 Encounter for general adult medical examination without abnormal findings: Secondary | ICD-10-CM

## 2023-12-26 LAB — LIPID PANEL
Cholesterol: 161 mg/dL (ref 0–200)
HDL: 32 mg/dL — ABNORMAL LOW (ref >39.00)
LDL Cholesterol: 98 mg/dL (ref 0–99)
NonHDL: 128.81
Total CHOL/HDL Ratio: 5
Triglycerides: 154 mg/dL — ABNORMAL HIGH (ref 0.0–149.0)
VLDL: 30.8 mg/dL (ref 0.0–40.0)

## 2023-12-26 NOTE — Progress Notes (Signed)
 Complete physical exam  Patient: Caleb Chandler   DOB: [birth year]   43 y.o. Male  MRN: [id number]  Subjective:    Chief Complaint  Patient presents with   Medical Management of Chronic Issues    Caleb Chandler is a 43 y.o. male who presents today for a complete physical exam. He reports consuming a general diet. The patient has a physically strenuous job, but has no regular exercise apart from work.  He generally feels well. He reports sleeping well. He does not have additional problems to discuss today.   Currently lives with: wife, daughter Acute concerns or interim problems since last visit: no  Vision concerns: no Dental concerns: no STD concerns: no  ETOH use: no Nicotine use: no Recreational drugs/illegal substances: no      Most recent fall risk assessment:    12/26/2023    8:22 AM  Fall Risk   Falls in the past year? 0  Number falls in past yr: 0  Injury with Fall? 0  Risk for fall due to : No Fall Risks  Follow up Falls evaluation completed     Most recent depression screenings:    12/26/2023    8:22 AM 10/31/2023   10:26 AM  PHQ 2/9 Scores  PHQ - 2 Score 0 0  PHQ- 9 Score  0            Patient Care Team: Almarie Waddell NOVAK, NP as PCP - General (Family Medicine)   Outpatient Medications Prior to Visit  Medication Sig   calcium carbonate (TUMS EX) 750 MG chewable tablet Chew 1 tablet by mouth daily.   No facility-administered medications prior to visit.    ROS All review of systems negative except what is listed in the HPI        Objective:     BP 126/88   Pulse 68   Ht 5' 8 (1.727 m)   Wt 207 lb (93.9 kg)   SpO2 97%   BMI 31.47 kg/m    Physical Exam Vitals reviewed.  Constitutional:      General: He is not in acute distress.    Appearance: Normal appearance. He is not ill-appearing.  HENT:     Head: Normocephalic and atraumatic.     Right Ear: Tympanic membrane normal.     Left Ear: Tympanic membrane normal.     Nose:  Nose normal.     Mouth/Throat:     Mouth: Mucous membranes are moist.     Pharynx: Oropharynx is clear.  Eyes:     Extraocular Movements: Extraocular movements intact.     Conjunctiva/sclera: Conjunctivae normal.     Pupils: Pupils are equal, round, and reactive to light.  Cardiovascular:     Rate and Rhythm: Normal rate and regular rhythm.     Pulses: Normal pulses.     Heart sounds: Normal heart sounds.  Pulmonary:     Effort: Pulmonary effort is normal.     Breath sounds: Normal breath sounds.  Abdominal:     General: Abdomen is flat. Bowel sounds are normal. There is no distension.     Palpations: Abdomen is soft. There is no mass.     Tenderness: There is no abdominal tenderness. There is no right CVA tenderness, left CVA tenderness, guarding or rebound.  Genitourinary:    Comments: Deferred exam Musculoskeletal:        General: Normal range of motion.     Cervical back: Normal range of motion and neck supple.  No tenderness.     Right lower leg: No edema.     Left lower leg: No edema.  Lymphadenopathy:     Cervical: No cervical adenopathy.  Skin:    General: Skin is warm and dry.     Capillary Refill: Capillary refill takes less than 2 seconds.  Neurological:     General: No focal deficit present.     Mental Status: He is alert and oriented to person, place, and time. Mental status is at baseline.  Psychiatric:        Mood and Affect: Mood normal.        Behavior: Behavior normal.        Thought Content: Thought content normal.        Judgment: Judgment normal.          No results found for any visits on 12/26/23.     Assessment & Plan:    Routine Health Maintenance and Physical Exam Discussed health promotion and safety including diet and exercise recommendations, dental health, and injury prevention. Tobacco cessation if applicable. Seat belts, sunscreen, smoke detectors, etc.    Immunization History  Administered Date(s) Administered   Hepatitis A  01/19/2017   Hepatitis A, Adult 01/19/2017   Influenza, Quadrivalent, Recombinant, Inj, Pf 03/04/2019   Influenza, Seasonal, Injecte, Preservative Fre 01/22/2018   Influenza,inj,Quad PF,6+ Mos 02/17/2017, 07/23/2018, 01/28/2020   Influenza-Unspecified 02/17/2017   MMR 01/19/2017, 01/22/2018   PFIZER Comirnaty(Gray Top)Covid-19 Tri-Sucrose Vaccine 07/10/2020   PFIZER(Purple Top)SARS-COV-2 Vaccination 06/05/2019, 06/26/2019   Td 01/19/2017, 02/17/2017   Td (Adult),5 Lf Tetanus Toxid, Preservative Free 02/17/2017, 08/17/2017   Tdap 01/19/2017    Health Maintenance  Topic Date Due   INFLUENZA VACCINE  12/15/2023   COVID-19 Vaccine (4 - 2024-25 season) 09/18/2024 (Originally 01/15/2023)   Hepatitis B Vaccines (1 of 3 - 19+ 3-dose series) 11/30/2024 (Originally 10/21/2000)   HPV VACCINES (1 - 3-dose SCDM series) 11/30/2024 (Originally 10/21/2008)   Pneumococcal Vaccine: 19-49 Years (1 of 2 - PCV) 12/03/2024 (Originally 10/21/2000)   DTaP/Tdap/Td (6 - Td or Tdap) 08/18/2027   Hepatitis C Screening  Completed   HIV Screening  Completed   Meningococcal B Vaccine  Aged Out        Problem List Items Addressed This Visit   None Visit Diagnoses       Annual physical exam    -  Primary   Relevant Orders   Lipid panel          PATIENT COUNSELING:     Recommend that most people either abstain from alcohol or drink within safe limits (<=14/week and <=4 drinks/occasion for males, <=7/weeks and <= 3 drinks/occasion for females) and that the risk for alcohol disorders and other health effects rises proportionally with the number of drinks per week and how often a drinker exceeds daily limits.   Diet: Recommend to adjust caloric intake to maintain or achieve ideal body weight, to reduce intake of dietary saturated fat and total fat, to limit sodium intake by avoiding high sodium foods and not adding table salt, and to maintain adequate dietary potassium and calcium preferably from fresh fruits,  vegetables, and low-fat dairy products.   Emphasized the importance of regular exercise.  Injury prevention: Recommend seatbelts, safety helmets, smoke detector, etc..   Dental health: Recommend regular tooth brushing, flossing, and dental visits.      Return in about 1 year (around 12/25/2024) for physical.     Waddell KATHEE Mon, NP

## 2023-12-27 ENCOUNTER — Ambulatory Visit: Payer: Self-pay | Admitting: Family Medicine

## 2024-01-04 ENCOUNTER — Encounter: Payer: Self-pay | Admitting: Urology

## 2024-01-04 ENCOUNTER — Ambulatory Visit: Admitting: Urology

## 2024-01-04 VITALS — BP 147/105 | HR 69 | Ht 66.54 in | Wt 207.0 lb

## 2024-01-04 DIAGNOSIS — N419 Inflammatory disease of prostate, unspecified: Secondary | ICD-10-CM

## 2024-01-04 DIAGNOSIS — N50812 Left testicular pain: Secondary | ICD-10-CM

## 2024-01-04 DIAGNOSIS — N529 Male erectile dysfunction, unspecified: Secondary | ICD-10-CM

## 2024-01-04 DIAGNOSIS — N50811 Right testicular pain: Secondary | ICD-10-CM | POA: Diagnosis not present

## 2024-01-04 LAB — URINALYSIS, ROUTINE W REFLEX MICROSCOPIC
Bilirubin, UA: NEGATIVE
Glucose, UA: NEGATIVE
Ketones, UA: NEGATIVE
Leukocytes,UA: NEGATIVE
Nitrite, UA: NEGATIVE
Protein,UA: NEGATIVE
RBC, UA: NEGATIVE
Specific Gravity, UA: 1.025 (ref 1.005–1.030)
Urobilinogen, Ur: 0.2 mg/dL (ref 0.2–1.0)
pH, UA: 5.5 (ref 5.0–7.5)

## 2024-01-04 MED ORDER — LEVOFLOXACIN 500 MG PO TABS: 500.0000 mg | ORAL_TABLET | Freq: Every day | ORAL | 0 refills | Status: AC

## 2024-01-04 NOTE — Progress Notes (Signed)
 Assessment: 1. Prostatitis, unspecified prostatitis type   2. Pain in both testicles   3. Erectile dysfunction, unspecified erectile dysfunction type     Plan: I personally reviewed the patient's chart including provider notes, imaging results. I reviewed the results of his recent scrotal ultrasound. His exam and ultrasound do not show any significant abnormalities that would account for testicular pain. Recommend treatment for prostatitis as this may be the cause of his intermittent oral testicular pain. Levaquin  500 mg daily x 21 days.  I discussed possible causes of erectile dysfunction. He does not have any significant risk factors for erectile dysfunction. I advised him that his erectile function may improve with treatment of his prostatitis and improvement in the pain associated with ejaculation.  If not, we will consider a trial of medical therapy for erectile dysfunction.  Return office in 4-6 weeks.   Chief Complaint:  Chief Complaint  Patient presents with   Testicle Pain    History of Present Illness:  Caleb Chandler is a 43 y.o. male who is seen in consultation from Almarie Birmingham B, NP for evaluation of bilateral scrotal pain and ED. He reports intermittent bilateral testicular pain for the past 8-9 months.  This occurs 2-3 times per day and last for <1-hour.  No exacerbating or alleviating factors.  No scrotal swelling or redness.  No history of scrotal trauma. Ultrasound from 12/05/2023 showed normal testes bilaterally, 4 mm left epididymal head cyst, trace bilateral hydroceles.  He also reports intermittent issues with difficulty maintaining his erections.  He has occasional rapid detumescence after 1-2 minutes.  He is able to achieve a good erection for intercourse.  No pain or curvature.  He has nocturnal and early morning erections.  No decrease in his libido.  He does have pain following ejaculation.    Past Medical History:  Past Medical History:  Diagnosis  Date   Chronic viral hepatitis B without delta-agent (HCC)    Mass of hand, right 03/2017    Past Surgical History:  Past Surgical History:  Procedure Laterality Date   EXCISION METACARPAL MASS Right 04/03/2017   Procedure: RIGHT HAND MASS EXCISION;  Surgeon: Sebastian Lenis, MD;  Location: [facility];  Service: Orthopedics;  Laterality: Right;    Allergies:  No Known Allergies  Family History:  No family history on file.  Social History:  Social History   Tobacco Use   Smoking status: Never   Smokeless tobacco: Never   Tobacco comments:    uses hookah rarely  Vaping Use   Vaping status: Former  Substance Use Topics   Alcohol use: No   Drug use: No    Review of symptoms:  Constitutional:  Negative for unexplained weight loss, night sweats, fever, chills ENT:  Negative for nose bleeds, sinus pain, painful swallowing CV:  Negative for chest pain, shortness of breath, exercise intolerance, palpitations, loss of consciousness Resp:  Negative for cough, wheezing, shortness of breath GI:  Negative for nausea, vomiting, diarrhea, bloody stools GU:  Positives noted in HPI; otherwise negative for gross hematuria, dysuria, urinary incontinence Neuro:  Negative for seizures, poor balance, limb weakness, slurred speech Psych:  Negative for lack of energy, depression, anxiety Endocrine:  Negative for polydipsia, polyuria, symptoms of hypoglycemia (dizziness, hunger, sweating) Hematologic:  Negative for anemia, purpura, petechia, prolonged or excessive bleeding, use of anticoagulants  Allergic:  Negative for difficulty breathing or choking as a result of exposure to anything; no shellfish allergy; no allergic response (rash/itch) to materials,  foods  Physical exam: BP (!) 147/105   Pulse 69   Ht 5' 6.54 (1.69 m)   Wt 207 lb (93.9 kg)   BMI 32.88 kg/m  GENERAL APPEARANCE:  Well appearing, well developed, well nourished, NAD HEENT: Atraumatic, Normocephalic,  oropharynx clear. NECK: Supple without lymphadenopathy or thyromegaly. LUNGS: Clear to auscultation bilaterally. HEART: Regular Rate and Rhythm without murmurs, gallops, or rubs. ABDOMEN: Soft, non-tender, No Masses. EXTREMITIES: Moves all extremities well.  Without clubbing, cyanosis, or edema. NEUROLOGIC:  Alert and oriented x 3, normal gait, CN II-XII grossly intact.  MENTAL STATUS:  Appropriate. BACK:  Non-tender to palpation.  No CVAT SKIN:  Warm, dry and intact.   GU: Penis:  uncircumcised Meatus: Normal Scrotum: No erythema or edema; no masses palpated Testis: normal without masses bilateral Epididymis: normal Prostate: 30 g, tender to palpation Rectum: Normal tone,  no masses or tenderness   Results: U/A:  negative

## 2024-02-08 ENCOUNTER — Encounter: Payer: Self-pay | Admitting: Urology

## 2024-02-08 ENCOUNTER — Ambulatory Visit: Admitting: Urology

## 2024-02-08 VITALS — BP 127/81 | HR 79 | Ht 66.14 in | Wt 208.0 lb

## 2024-02-08 DIAGNOSIS — N419 Inflammatory disease of prostate, unspecified: Secondary | ICD-10-CM

## 2024-02-08 DIAGNOSIS — N529 Male erectile dysfunction, unspecified: Secondary | ICD-10-CM | POA: Diagnosis not present

## 2024-02-08 LAB — URINALYSIS, ROUTINE W REFLEX MICROSCOPIC
Bilirubin, UA: NEGATIVE
Glucose, UA: NEGATIVE
Ketones, UA: NEGATIVE
Leukocytes,UA: NEGATIVE
Nitrite, UA: NEGATIVE
Protein,UA: NEGATIVE
RBC, UA: NEGATIVE
Specific Gravity, UA: 1.025 (ref 1.005–1.030)
Urobilinogen, Ur: 0.2 mg/dL (ref 0.2–1.0)
pH, UA: 5.5 (ref 5.0–7.5)

## 2024-02-08 NOTE — Progress Notes (Signed)
 Assessment: 1. Prostatitis, unspecified prostatitis type   2. Erectile dysfunction, unspecified erectile dysfunction type     Plan: His symptoms have improved following treatment for prostatitis. I advised him that his erectile function may improve following treatment for the prostatitis.  If not, we will consider a trial of medical therapy for erectile dysfunction.  Return office in 3 months   Chief Complaint:  Chief Complaint  Patient presents with   Prostatitis    History of Present Illness:  Caleb Chandler is a 43 y.o. male who is seen for further evaluation of prostatitis. He was seen in August 2025 for evaluation of bilateral scrotal pain and ED. He reported intermittent bilateral testicular pain for 8-9 months.  The pain occurred 2-3 times per day and would last for <1-hour.  No exacerbating or alleviating factors.  No scrotal swelling or redness.  No history of scrotal trauma. Ultrasound from 12/05/2023 showed normal testes bilaterally, 4 mm left epididymal head cyst, trace bilateral hydroceles.  He also reported intermittent issues with difficulty maintaining his erections.  He reported occasional rapid detumescence after 1-2 minutes.  He is able to achieve a good erection for intercourse.  No pain or curvature.  He has nocturnal and early morning erections.  No decrease in his libido.  He does have pain following ejaculation.  His exam was consistent with prostatitis.  He was treated with Levaquin  x 21 days.  He returns today for follow-up.  He completed the antibiotics approximately 1 week ago.  He has noted improvement in his scrotal pain.  He reports the pain is much less frequent and less intense.  He is not having any significant lower urinary tract symptoms.  He has not attempted intercourse due to his wife's pregnancy.  He is having morning erections without discomfort.  Portions of the above documentation were copied from a prior visit for review purposes  only.   Past Medical History:  Past Medical History:  Diagnosis Date   Chronic viral hepatitis B without delta-agent (HCC)    Mass of hand, right 03/2017    Past Surgical History:  Past Surgical History:  Procedure Laterality Date   EXCISION METACARPAL MASS Right 04/03/2017   Procedure: RIGHT HAND MASS EXCISION;  Surgeon: Sebastian Lenis, MD;  Location: [facility];  Service: Orthopedics;  Laterality: Right;    Allergies:  No Known Allergies  Family History:  No family history on file.  Social History:  Social History   Tobacco Use   Smoking status: Never   Smokeless tobacco: Never   Tobacco comments:    uses hookah rarely  Vaping Use   Vaping status: Former  Substance Use Topics   Alcohol use: No   Drug use: No    ROS: Constitutional:  Negative for fever, chills, weight loss CV: Negative for chest pain, previous MI, hypertension Respiratory:  Negative for shortness of breath, wheezing, sleep apnea, frequent cough GI:  Negative for nausea, vomiting, bloody stool, GERD  Physical exam: BP 127/81   Pulse 79   Ht 5' 6.14 (1.68 m)   Wt 208 lb (94.3 kg)   BMI 33.43 kg/m  GENERAL APPEARANCE:  Well appearing, well developed, well nourished, NAD HEENT:  Atraumatic, normocephalic, oropharynx clear NECK:  Supple without lymphadenopathy or thyromegaly ABDOMEN:  Soft, non-tender, no masses EXTREMITIES:  Moves all extremities well, without clubbing, cyanosis, or edema NEUROLOGIC:  Alert and oriented x 3, normal gait, CN II-XII grossly intact MENTAL STATUS:  appropriate BACK:  Non-tender to  palpation, No CVAT SKIN:  Warm, dry, and intact  Results: U/A: negative

## 2024-05-02 ENCOUNTER — Other Ambulatory Visit: Payer: Self-pay

## 2024-05-02 ENCOUNTER — Ambulatory Visit: Admitting: Internal Medicine

## 2024-05-02 VITALS — BP 123/85 | HR 73 | Temp 98.3°F | Resp 16 | Wt 213.6 lb

## 2024-05-02 DIAGNOSIS — Z129 Encounter for screening for malignant neoplasm, site unspecified: Secondary | ICD-10-CM

## 2024-05-02 DIAGNOSIS — B181 Chronic viral hepatitis B without delta-agent: Secondary | ICD-10-CM | POA: Diagnosis not present

## 2024-05-02 NOTE — Progress Notes (Signed)
 [facility]  Patient Active Problem List   Diagnosis Date Noted   Prostatitis 02/08/2024   Erectile dysfunction 02/08/2024   Travel advice encounter 09/30/2020   Precordial pain 08/02/2020   Thrombocytopenia 07/23/2018   Chronic viral hepatitis B without delta-agent (HCC) 02/21/2017    Patient's Medications  New Prescriptions   No medications on file  Previous Medications   CALCIUM CARBONATE (TUMS EX) 750 MG CHEWABLE TABLET    Chew 1 tablet by mouth daily.  Modified Medications   No medications on file  Discontinued Medications   No medications on file    Subjective: Caleb Chandler is in for his routine follow-up visit for hepatitis b  05/02/24 id clinic f/u Going to [place] in 06/2024 and also just had a 44 week old girl Doing well Recent prostate infection saw urology No other complaint   10/31/23 id clinic f/u No issue today Routine f/u for chronic hep B   ---------------- He previously saw dr Caleb Chandler First visit with me today 03/29/23  Social: Still working 2 jobs, 7 days a week except holidays Patient has a family --> wife and one 35 year old girl, and extended family back home [place] 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 Has malaria prophylaxis    Patient was briefly on hep medication in [place] 2016    Review of Systems: Review of Systems  Constitutional:  Negative for chills, diaphoresis, fever and weight loss.  Gastrointestinal:  Negative for abdominal pain, diarrhea, nausea and vomiting.  Skin:  Negative for rash.    Past Medical History:  Diagnosis Date   Chronic viral hepatitis B without delta-agent (HCC)    Mass of hand, right 03/2017    Social History   Tobacco Use   Smoking status: Never   Smokeless tobacco: Never   Tobacco comments:    uses hookah rarely  Vaping Use   Vaping  status: Former  Substance Use Topics   Alcohol use: No   Drug use: No    No family history on file.  No Known Allergies  Objective: Vitals:   05/02/24 0845 05/02/24 0846  BP:  123/85  Pulse:  73  Resp: 16 16  Temp:  98.3 F (36.8 C)  TempSrc:  Oral  SpO2:  96%  Weight: 213 lb 9.6 oz (96.9 kg)    Body mass index is 34.33 kg/m.  Physical Exam Constitutional:      Comments: He is in good spirits.  Abdominal:     General: There is no distension.     Palpations: Abdomen is soft. There is no mass.     Tenderness: There is no abdominal tenderness.  Skin:    Findings: No rash.  Neurological:     Mental Status: He is alert.  Psychiatric:        Mood and Affect: Mood normal.    Labs: Lab Results  Component Value Date   WBC 5.1 10/31/2023   HGB 15.7 10/31/2023   HCT 46.5 10/31/2023   MCV 91.7 10/31/2023   PLT 157 10/31/2023   Last metabolic panel Lab Results  Component Value Date   GLUCOSE 94 10/31/2023   NA 137 10/31/2023   K 4.1 10/31/2023   CL 106 10/31/2023   CO2 23 10/31/2023   BUN 17 10/31/2023   CREATININE 0.86 10/31/2023   EGFR 98 03/16/2023  CALCIUM 9.8 10/31/2023   PROT 7.6 10/31/2023   ALBUMIN 4.7 07/23/2018   LABGLOB 3.2 07/23/2018   AGRATIO 1.5 07/23/2018   BILITOT 0.4 10/31/2023   ALKPHOS 56 07/23/2018   AST 17 10/31/2023   ALT 25 10/31/2023   ANIONGAP 8 03/29/2017    Fibrotest 09/30/2020 F0   Imaging: Reviewed  03/2023 liver u/s No sign of cirrhosis or hepatoma    Problem List Items Addressed This Visit       Digestive   Chronic viral hepatitis B without delta-agent (HCC) - Primary   Relevant Orders   CBC   COMPLETE METABOLIC PANEL WITHOUT GFR   Hepatitis B DNA, ultraquantitative, PCR   Hepatitis B e antigen   Hepatitis B e antibody   Hepatitis B Surface AntiGEN   Hepatitis B surface antibody,quantitative   US  Abdomen Limited RUQ (LIVER/GB)   Other Visit Diagnoses       Cancer screening       Relevant Orders    US  Abdomen Limited RUQ (LIVER/GB)        Patient briefly on treatment 2016 but taken off at [facility] His 2023 onward lft and hepatitis b dna level have not met new who criteria for treatment  We reviewed new who guideline for treatment (alt > 35 or dna level > 2000) We reviewed hcc screening guideline and will need to start ordering  Lft and cbc from 02/2023 continues to not meet criteria for treatment  U/s ordered F/u 6 months  --------- 05/02/24 id clinic assessment Doing well Liver u/s 03/2023 normal -- will order repeat  He doesn't drink etoh  He wants to do yearly u/s rather than twice a year -- Guideline hep b care source (who) referred to patient  Labs due today  F/u 6 months Remains off medication at this time     Constance Caleb Passer, MD [facility]  05/02/2024, 8:57 AM

## 2024-05-02 NOTE — Patient Instructions (Signed)
 Please schedule your liver ultrasound up front  Follow up with me 1 year for this time

## 2024-05-07 ENCOUNTER — Ambulatory Visit: Payer: Self-pay | Admitting: Internal Medicine

## 2024-05-07 LAB — CBC
HCT: 47 % (ref 39.4–51.1)
Hemoglobin: 15.9 g/dL (ref 13.2–17.1)
MCH: 30.2 pg (ref 27.0–33.0)
MCHC: 33.8 g/dL (ref 31.6–35.4)
MCV: 89.2 fL (ref 81.4–101.7)
MPV: 10.9 fL (ref 7.5–12.5)
Platelets: 155 Thousand/uL (ref 140–400)
RBC: 5.27 Million/uL (ref 4.20–5.80)
RDW: 13 % (ref 11.0–15.0)
WBC: 4.9 Thousand/uL (ref 3.8–10.8)

## 2024-05-07 LAB — COMPLETE METABOLIC PANEL WITHOUT GFR
AG Ratio: 1.5 (calc) (ref 1.0–2.5)
ALT: 26 U/L (ref 9–46)
AST: 20 U/L (ref 10–40)
Albumin: 4.5 g/dL (ref 3.6–5.1)
Alkaline phosphatase (APISO): 50 U/L (ref 36–130)
BUN: 16 mg/dL (ref 7–25)
CO2: 26 mmol/L (ref 20–32)
Calcium: 9.1 mg/dL (ref 8.6–10.3)
Chloride: 106 mmol/L (ref 98–110)
Creat: 0.88 mg/dL (ref 0.60–1.29)
Globulin: 3 g/dL (ref 1.9–3.7)
Glucose, Bld: 106 mg/dL — ABNORMAL HIGH (ref 65–99)
Potassium: 3.9 mmol/L (ref 3.5–5.3)
Sodium: 139 mmol/L (ref 135–146)
Total Bilirubin: 0.6 mg/dL (ref 0.2–1.2)
Total Protein: 7.5 g/dL (ref 6.1–8.1)

## 2024-05-07 LAB — HEPATITIS B DNA, ULTRAQUANTITATIVE, PCR
Hepatitis B DNA: 2290 [IU]/mL — ABNORMAL HIGH
Hepatitis B virus DNA: 3.36 {Log_IU}/mL — ABNORMAL HIGH

## 2024-05-07 LAB — HEPATITIS B E ANTIBODY: Hep B E Ab: REACTIVE — AB

## 2024-05-07 LAB — HEPATITIS B SURFACE ANTIBODY, QUANTITATIVE: Hep B S AB Quant (Post): <5 m[IU]/mL — ABNORMAL LOW

## 2024-05-07 LAB — HEPATITIS B E ANTIGEN: Hep B E Ag: NONREACTIVE

## 2024-05-07 LAB — HEPATITIS B SURFACE ANTIGEN: Hepatitis B Surface Ag: REACTIVE — AB

## 2024-05-13 ENCOUNTER — Ambulatory Visit (HOSPITAL_COMMUNITY)
Admission: RE | Admit: 2024-05-13 | Discharge: 2024-05-13 | Disposition: A | Discharge status: Home / Self Care | Source: Ambulatory Visit | Attending: Internal Medicine | Admitting: Internal Medicine

## 2024-05-13 DIAGNOSIS — B181 Chronic viral hepatitis B without delta-agent: Secondary | ICD-10-CM | POA: Insufficient documentation

## 2024-05-13 DIAGNOSIS — Z129 Encounter for screening for malignant neoplasm, site unspecified: Secondary | ICD-10-CM | POA: Insufficient documentation

## 2024-05-15 ENCOUNTER — Encounter: Payer: Self-pay | Admitting: Urology

## 2024-05-15 ENCOUNTER — Ambulatory Visit: Admitting: Urology

## 2024-05-15 VITALS — BP 123/85 | HR 77 | Ht 66.54 in | Wt 210.0 lb

## 2024-05-15 DIAGNOSIS — Z87438 Personal history of other diseases of male genital organs: Secondary | ICD-10-CM

## 2024-05-15 DIAGNOSIS — N529 Male erectile dysfunction, unspecified: Secondary | ICD-10-CM

## 2024-05-15 LAB — URINALYSIS, ROUTINE W REFLEX MICROSCOPIC
Bilirubin, UA: NEGATIVE
Glucose, UA: NEGATIVE
Ketones, UA: NEGATIVE
Leukocytes,UA: NEGATIVE
Nitrite, UA: NEGATIVE
Protein,UA: NEGATIVE
RBC, UA: NEGATIVE
Specific Gravity, UA: 1.025 (ref 1.005–1.030)
Urobilinogen, Ur: 0.2 mg/dL (ref 0.2–1.0)
pH, UA: 5.5 (ref 5.0–7.5)

## 2024-05-15 MED ORDER — TADALAFIL 20 MG PO TABS: 20.0000 mg | ORAL_TABLET | Freq: Every day | ORAL | 11 refills | Status: AC | PRN

## 2024-05-15 NOTE — Progress Notes (Signed)
 "  Assessment: 1. Erectile dysfunction, unspecified erectile dysfunction type   2. History of prostatitis      Plan: Diagnosis and management of erectile dysfunction discussed with the patient.  He would like to proceed with a trial of medical therapy.  I discussed the role of 5 phosphodiesterase inhibitors and management of erectile dysfunction. Trial of tadalafil 10-20 mg as needed.  Prescription provided along with GoodRx coupon. Return office in 6 months   Chief Complaint:  Chief Complaint  Patient presents with   Prostatitis    History of Present Illness:  Caleb Chandler is a 43 y.o. male who is seen for further evaluation of prostatitis. He was seen in August 2025 for evaluation of bilateral scrotal pain and ED. He reported intermittent bilateral testicular pain for 8-9 months.  The pain occurred 2-3 times per day and would last for <1-hour.  No exacerbating or alleviating factors.  No scrotal swelling or redness.  No history of scrotal trauma. Ultrasound from 12/05/2023 showed normal testes bilaterally, 4 mm left epididymal head cyst, trace bilateral hydroceles.  He also reported intermittent issues with difficulty maintaining his erections.  He reported occasional rapid detumescence after 1-2 minutes.  He was able to achieve a good erection for intercourse.  No pain or curvature.  He has nocturnal and early morning erections.  No decrease in his libido.  He reported pain following ejaculation.  His exam was consistent with prostatitis.  He was treated with Levaquin  x 21 days.  At his visit in September 2025, he had completed the antibiotics approximately 1 week prior  He noted improvement in his scrotal pain.  He reported the pain was much less frequent and less intense.  He was not having any significant lower urinary tract symptoms.  He had not attempted intercourse due to his wife's pregnancy.  He was having morning erections without discomfort.  He returns today for  follow-up.  He is not having any lower urinary tract symptoms.  He continues to have intermittent problems with erectile dysfunction.  He reports some difficulty maintaining his erection at times.  No pain or curvature.  No pain with ejaculation.  No decrease in his libido.  He has not tried any medical therapy for ED.  Portions of the above documentation were copied from a prior visit for review purposes only.   Past Medical History:  Past Medical History:  Diagnosis Date   Chronic viral hepatitis B without delta-agent (HCC)    Mass of hand, right 03/2017    Past Surgical History:  Past Surgical History:  Procedure Laterality Date   EXCISION METACARPAL MASS Right 04/03/2017   Procedure: RIGHT HAND MASS EXCISION;  Surgeon: Sebastian Lenis, MD;  Location: [facility];  Service: Orthopedics;  Laterality: Right;    Allergies:  No Known Allergies  Family History:  No family history on file.  Social History:  Social History   Tobacco Use   Smoking status: Never   Smokeless tobacco: Never   Tobacco comments:    uses hookah rarely  Vaping Use   Vaping status: Former  Substance Use Topics   Alcohol use: No   Drug use: No    ROS: Constitutional:  Negative for fever, chills, weight loss CV: Negative for chest pain, previous MI, hypertension Respiratory:  Negative for shortness of breath, wheezing, sleep apnea, frequent cough GI:  Negative for nausea, vomiting, bloody stool, GERD  Physical exam: BP 123/85   Pulse 77   Ht 5' 6.54 (1.69  m)   Wt 210 lb (95.3 kg)   BMI 33.35 kg/m  GENERAL APPEARANCE:  Well appearing, well developed, well nourished, NAD HEENT:  Atraumatic, normocephalic, oropharynx clear NECK:  Supple without lymphadenopathy or thyromegaly ABDOMEN:  Soft, non-tender, no masses EXTREMITIES:  Moves all extremities well, without clubbing, cyanosis, or edema NEUROLOGIC:  Alert and oriented x 3, normal gait, CN II-XII grossly intact MENTAL STATUS:   appropriate BACK:  Non-tender to palpation, No CVAT SKIN:  Warm, dry, and intact  Results: U/A: Negative "

## 2024-05-28 ENCOUNTER — Encounter: Payer: Self-pay | Admitting: Family Medicine

## 2024-06-06 ENCOUNTER — Encounter: Payer: Self-pay | Admitting: Family Medicine

## 2024-06-06 ENCOUNTER — Telehealth: Admitting: Family Medicine

## 2024-06-06 VITALS — Ht 66.0 in | Wt 210.0 lb

## 2024-06-06 DIAGNOSIS — Z7184 Encounter for health counseling related to travel: Secondary | ICD-10-CM

## 2024-06-06 MED ORDER — AZITHROMYCIN 500 MG PO TABS: 500.0000 mg | ORAL_TABLET | Freq: Every day | ORAL | 0 refills | Status: AC

## 2024-06-06 MED ORDER — ATOVAQUONE-PROGUANIL HCL 250-100 MG PO TABS: 1.0000 | ORAL_TABLET | Freq: Every day | ORAL | 0 refills | Status: AC

## 2024-06-06 NOTE — Progress Notes (Signed)
 Virtual Video Visit via MyChart Note  I connected with  Caleb Chandler on 06/06/2024 at  1:00 PM EST by the video enabled telemedicine application for MyChart, and verified that I am speaking with the correct person using two identifiers.   I introduced myself as a Publishing Rights Manager with the practice. We discussed the limitations of evaluation and management by telemedicine and the availability of in person appointments. The patient expressed understanding and agreed to proceed.  Participating parties in this visit include: The patient and the nurse practitioner listed.  The patient is: At home I am: In the office - [facility]  Subjective:    CC:  Chief Complaint  Patient presents with   Medical Management of Chronic Issues    HPI: Caleb Chandler is a 44 y.o. year old male presenting today via MyChart today for travel medications.   Discussed the use of AI scribe software for clinical note transcription with the patient, who gave verbal consent to proceed.  History of Present Illness Caleb Chandler is a 44 year old male who presents for malaria prophylaxis prior to travel.  He is planning a ten-week trip to [place], [place], and [place].  He recalls previously taking a malaria prophylaxis medication described as a 'reddish orange tablet' daily without any side effects. He took it for approximately nine weeks during his last trip.  He inquires about medication for traveler's diarrhea, mentioning that he has not needed it in the past but wants to have it available. He identifies azithromycin  as a familiar option for this purpose.       Past medical history, Surgical history, Family history not pertinant except as noted below, Social history, Allergies, and medications have been entered into the medical record, reviewed, and corrections made.   Review of Systems:  All review of systems negative except what is listed in the  HPI   Objective:    General:  Speaking clearly in complete sentences. Absent shortness of breath noted.   Alert and oriented x3.   Normal judgment.  Absent acute distress.   Impression and Recommendations:    Problem List Items Addressed This Visit       Active Problems   Travel advice encounter - Primary   Relevant Medications   atovaquone -proguanil (MALARONE ) 250-100 MG TABS tablet   azithromycin  (ZITHROMAX ) 500 MG tablet    Assessment & Plan Malaria prophylaxis for travel - Prescribed Malarone  (atovaquone -proguanil). - Instructed to take one tablet daily starting 1-2 days before entering areas at risk for Malaria, continue daily during stay, and for one week after leaving.  Traveler's diarrhea prophylaxis Traveler's diarrhea prophylaxis discussed. Azithromycin  prescribed for use if diarrhea occurs during travel. - Prescribed azithromycin  for traveler's diarrhea, to be used as needed.     Follow-up if symptoms worsen or fail to improve.    I discussed the assessment and treatment plan with the patient. The patient was provided an opportunity to ask questions and all were answered. The patient agreed with the plan and demonstrated an understanding of the instructions.   The patient was advised to call back or seek an in-person evaluation if the symptoms worsen or if the condition fails to improve as anticipated.   I,Emily Lagle,acting as a neurosurgeon for Waddell KATHEE Mon, NP.,have documented all relevant documentation on the behalf of Waddell KATHEE Mon, NP.  I, Waddell KATHEE Mon, NP, have reviewed all documentation for this visit. The documentation on 06/06/2024 for the exam,  diagnosis, procedures, and orders are all accurate and complete.

## 2024-09-17 ENCOUNTER — Ambulatory Visit: Payer: Self-pay | Admitting: Infectious Diseases

## 2024-11-14 ENCOUNTER — Ambulatory Visit: Admitting: Urology

## 2025-04-22 ENCOUNTER — Ambulatory Visit: Payer: Self-pay | Admitting: Internal Medicine
